# Patient Record
Sex: Female | Born: 1955 | Race: White | Hispanic: No | Marital: Single | Smoking: Never smoker
Health system: Southern US, Community
[De-identification: ages and names within clinical notes are randomized; demographics above are authoritative.]

## PROBLEM LIST (undated history)

## (undated) DIAGNOSIS — N289 Disorder of kidney and ureter, unspecified: Secondary | ICD-10-CM

## (undated) DIAGNOSIS — C801 Malignant (primary) neoplasm, unspecified: Secondary | ICD-10-CM

## (undated) DIAGNOSIS — K254 Chronic or unspecified gastric ulcer with hemorrhage: Secondary | ICD-10-CM

## (undated) DIAGNOSIS — Z992 Dependence on renal dialysis: Secondary | ICD-10-CM

## (undated) DIAGNOSIS — I10 Essential (primary) hypertension: Secondary | ICD-10-CM

## (undated) DIAGNOSIS — K623 Rectal prolapse: Secondary | ICD-10-CM

## (undated) DIAGNOSIS — K219 Gastro-esophageal reflux disease without esophagitis: Secondary | ICD-10-CM

## (undated) HISTORY — DX: Rectal prolapse: K62.3

## (undated) HISTORY — DX: Gastro-esophageal reflux disease without esophagitis: K21.9

## (undated) HISTORY — PX: ABDOMINAL HYSTERECTOMY: SHX81

---

## 2004-06-23 ENCOUNTER — Other Ambulatory Visit: Payer: Self-pay

## 2005-01-10 ENCOUNTER — Ambulatory Visit: Payer: Self-pay

## 2005-12-12 ENCOUNTER — Ambulatory Visit: Payer: Self-pay | Admitting: Family Medicine

## 2007-02-04 ENCOUNTER — Ambulatory Visit: Payer: Self-pay | Admitting: Family Medicine

## 2008-03-01 ENCOUNTER — Ambulatory Visit: Payer: Self-pay | Admitting: Gastroenterology

## 2008-03-01 DIAGNOSIS — K5790 Diverticulosis of intestine, part unspecified, without perforation or abscess without bleeding: Secondary | ICD-10-CM | POA: Insufficient documentation

## 2008-03-03 ENCOUNTER — Ambulatory Visit: Payer: Self-pay | Admitting: Family Medicine

## 2008-03-11 ENCOUNTER — Ambulatory Visit: Payer: Self-pay | Admitting: Gastroenterology

## 2008-05-11 DIAGNOSIS — R7989 Other specified abnormal findings of blood chemistry: Secondary | ICD-10-CM | POA: Insufficient documentation

## 2009-03-10 ENCOUNTER — Ambulatory Visit: Payer: Self-pay | Admitting: Family Medicine

## 2009-05-18 ENCOUNTER — Ambulatory Visit: Payer: Self-pay | Admitting: Obstetrics and Gynecology

## 2009-05-23 ENCOUNTER — Ambulatory Visit: Payer: Self-pay | Admitting: Obstetrics and Gynecology

## 2010-03-13 ENCOUNTER — Ambulatory Visit: Payer: Self-pay | Admitting: Family Medicine

## 2011-05-17 ENCOUNTER — Ambulatory Visit: Payer: Self-pay | Admitting: Family Medicine

## 2011-08-14 DIAGNOSIS — N184 Chronic kidney disease, stage 4 (severe): Secondary | ICD-10-CM | POA: Insufficient documentation

## 2011-08-14 DIAGNOSIS — F70 Mild intellectual disabilities: Secondary | ICD-10-CM | POA: Insufficient documentation

## 2011-08-15 DIAGNOSIS — H9190 Unspecified hearing loss, unspecified ear: Secondary | ICD-10-CM | POA: Insufficient documentation

## 2012-06-12 ENCOUNTER — Ambulatory Visit: Payer: Self-pay | Admitting: Physician Assistant

## 2012-12-30 ENCOUNTER — Ambulatory Visit: Payer: Self-pay | Admitting: Gastroenterology

## 2012-12-31 LAB — PATHOLOGY REPORT

## 2013-02-28 LAB — COMPREHENSIVE METABOLIC PANEL
Alkaline Phosphatase: 95 U/L (ref 50–136)
Anion Gap: 5 — ABNORMAL LOW (ref 7–16)
BUN: 41 mg/dL — ABNORMAL HIGH (ref 7–18)
Calcium, Total: 9 mg/dL (ref 8.5–10.1)
Chloride: 111 mmol/L — ABNORMAL HIGH (ref 98–107)
Creatinine: 2.81 mg/dL — ABNORMAL HIGH (ref 0.60–1.30)
EGFR (African American): 21 — ABNORMAL LOW
Glucose: 107 mg/dL — ABNORMAL HIGH (ref 65–99)
Osmolality: 294 (ref 275–301)
Potassium: 4.3 mmol/L (ref 3.5–5.1)
SGPT (ALT): 20 U/L (ref 12–78)
Sodium: 142 mmol/L (ref 136–145)
Total Protein: 7.1 g/dL (ref 6.4–8.2)

## 2013-02-28 LAB — CBC
HCT: 37.8 % (ref 35.0–47.0)
HGB: 12.7 g/dL (ref 12.0–16.0)
MCH: 30.6 pg (ref 26.0–34.0)
MCHC: 33.7 g/dL (ref 32.0–36.0)
Platelet: 191 10*3/uL (ref 150–440)
RBC: 4.17 10*6/uL (ref 3.80–5.20)
RDW: 13.4 % (ref 11.5–14.5)
WBC: 7.4 10*3/uL (ref 3.6–11.0)

## 2013-03-01 ENCOUNTER — Inpatient Hospital Stay: Payer: Self-pay | Admitting: Emergency Medicine

## 2013-03-01 LAB — CBC WITH DIFFERENTIAL/PLATELET
Basophil #: 0 10*3/uL (ref 0.0–0.1)
Basophil %: 0.4 %
Eosinophil #: 0 10*3/uL (ref 0.0–0.7)
Eosinophil %: 0.2 %
HCT: 33.8 % — ABNORMAL LOW (ref 35.0–47.0)
HGB: 11.3 g/dL — ABNORMAL LOW (ref 12.0–16.0)
Lymphocyte #: 0.6 10*3/uL — ABNORMAL LOW (ref 1.0–3.6)
MCV: 91 fL (ref 80–100)
Monocyte %: 3.8 %
Neutrophil #: 9.3 10*3/uL — ABNORMAL HIGH (ref 1.4–6.5)
Neutrophil %: 89.9 %
Platelet: 146 10*3/uL — ABNORMAL LOW (ref 150–440)
RBC: 3.72 10*6/uL — ABNORMAL LOW (ref 3.80–5.20)
RDW: 13.4 % (ref 11.5–14.5)

## 2013-03-01 LAB — PROTIME-INR
INR: 0.9
Prothrombin Time: 12.8 secs (ref 11.5–14.7)

## 2013-03-02 LAB — CBC WITH DIFFERENTIAL/PLATELET
Eosinophil %: 0.1 %
HCT: 33.4 % — ABNORMAL LOW (ref 35.0–47.0)
Lymphocyte #: 0.7 10*3/uL — ABNORMAL LOW (ref 1.0–3.6)
Lymphocyte %: 6.1 %
MCHC: 34.2 g/dL (ref 32.0–36.0)
MCV: 91 fL (ref 80–100)
Monocyte %: 4.4 %
Neutrophil #: 10.7 10*3/uL — ABNORMAL HIGH (ref 1.4–6.5)
Neutrophil %: 88.9 %
Platelet: 162 10*3/uL (ref 150–440)
RDW: 13.5 % (ref 11.5–14.5)
WBC: 12 10*3/uL — ABNORMAL HIGH (ref 3.6–11.0)

## 2013-03-02 LAB — BASIC METABOLIC PANEL
Anion Gap: 5 — ABNORMAL LOW (ref 7–16)
Creatinine: 2.28 mg/dL — ABNORMAL HIGH (ref 0.60–1.30)
EGFR (African American): 27 — ABNORMAL LOW
Glucose: 141 mg/dL — ABNORMAL HIGH (ref 65–99)
Sodium: 139 mmol/L (ref 136–145)

## 2013-03-03 LAB — CBC WITH DIFFERENTIAL/PLATELET
Basophil #: 0.1 10*3/uL (ref 0.0–0.1)
Basophil %: 0.7 %
Eosinophil #: 0.4 10*3/uL (ref 0.0–0.7)
Eosinophil %: 5.1 %
HCT: 32.6 % — ABNORMAL LOW (ref 35.0–47.0)
Lymphocyte #: 1.1 10*3/uL (ref 1.0–3.6)
Lymphocyte %: 12.7 %
MCH: 31.3 pg (ref 26.0–34.0)
MCHC: 34.3 g/dL (ref 32.0–36.0)
MCV: 91 fL (ref 80–100)
Monocyte %: 5.5 %
Neutrophil #: 6.5 10*3/uL (ref 1.4–6.5)
Platelet: 146 10*3/uL — ABNORMAL LOW (ref 150–440)
RBC: 3.58 10*6/uL — ABNORMAL LOW (ref 3.80–5.20)

## 2013-03-04 LAB — PATHOLOGY REPORT

## 2013-03-04 LAB — RENAL FUNCTION PANEL
Albumin: 2.4 g/dL — ABNORMAL LOW (ref 3.4–5.0)
Anion Gap: 4 — ABNORMAL LOW (ref 7–16)
BUN: 35 mg/dL — ABNORMAL HIGH (ref 7–18)
Chloride: 101 mmol/L (ref 98–107)
Co2: 30 mmol/L (ref 21–32)
Creatinine: 2.76 mg/dL — ABNORMAL HIGH (ref 0.60–1.30)
EGFR (African American): 21 — ABNORMAL LOW
EGFR (Non-African Amer.): 18 — ABNORMAL LOW
Osmolality: 278 (ref 275–301)
Potassium: 4.2 mmol/L (ref 3.5–5.1)
Sodium: 135 mmol/L — ABNORMAL LOW (ref 136–145)

## 2013-03-05 LAB — RENAL FUNCTION PANEL
Albumin: 2.2 g/dL — ABNORMAL LOW (ref 3.4–5.0)
Calcium, Total: 8.9 mg/dL (ref 8.5–10.1)
Chloride: 100 mmol/L (ref 98–107)
Potassium: 3.9 mmol/L (ref 3.5–5.1)
Sodium: 135 mmol/L — ABNORMAL LOW (ref 136–145)

## 2013-03-06 LAB — CBC WITH DIFFERENTIAL/PLATELET
Eosinophil #: 0.5 10*3/uL (ref 0.0–0.7)
HGB: 11.2 g/dL — ABNORMAL LOW (ref 12.0–16.0)
Lymphocyte #: 1.2 10*3/uL (ref 1.0–3.6)
MCHC: 32.8 g/dL (ref 32.0–36.0)
MCV: 91 fL (ref 80–100)
Neutrophil #: 4.2 10*3/uL (ref 1.4–6.5)
Platelet: 202 10*3/uL (ref 150–440)
RBC: 3.77 10*6/uL — ABNORMAL LOW (ref 3.80–5.20)
RDW: 12.7 % (ref 11.5–14.5)
WBC: 6.4 10*3/uL (ref 3.6–11.0)

## 2013-03-06 LAB — RENAL FUNCTION PANEL
Albumin: 2.4 g/dL — ABNORMAL LOW (ref 3.4–5.0)
Anion Gap: 3 — ABNORMAL LOW (ref 7–16)
Calcium, Total: 9 mg/dL (ref 8.5–10.1)
Co2: 31 mmol/L (ref 21–32)
Phosphorus: 3.7 mg/dL (ref 2.5–4.9)

## 2013-03-09 LAB — RENAL FUNCTION PANEL
Anion Gap: 4 — ABNORMAL LOW (ref 7–16)
BUN: 39 mg/dL — ABNORMAL HIGH (ref 7–18)
Chloride: 102 mmol/L (ref 98–107)
Co2: 29 mmol/L (ref 21–32)
Creatinine: 2.82 mg/dL — ABNORMAL HIGH (ref 0.60–1.30)
EGFR (African American): 21 — ABNORMAL LOW
EGFR (Non-African Amer.): 18 — ABNORMAL LOW
Osmolality: 279 (ref 275–301)
Phosphorus: 3.5 mg/dL (ref 2.5–4.9)

## 2013-07-23 ENCOUNTER — Ambulatory Visit: Payer: Self-pay | Admitting: Physician Assistant

## 2014-05-31 DIAGNOSIS — E211 Secondary hyperparathyroidism, not elsewhere classified: Secondary | ICD-10-CM | POA: Insufficient documentation

## 2014-05-31 DIAGNOSIS — D649 Anemia, unspecified: Secondary | ICD-10-CM | POA: Insufficient documentation

## 2014-07-27 ENCOUNTER — Ambulatory Visit: Payer: Self-pay | Admitting: Physician Assistant

## 2014-11-03 DIAGNOSIS — K3189 Other diseases of stomach and duodenum: Secondary | ICD-10-CM | POA: Insufficient documentation

## 2014-11-03 DIAGNOSIS — Q605 Renal hypoplasia, unspecified: Secondary | ICD-10-CM

## 2014-11-03 DIAGNOSIS — Q602 Renal agenesis, unspecified: Secondary | ICD-10-CM | POA: Insufficient documentation

## 2014-11-26 ENCOUNTER — Emergency Department: Payer: Self-pay | Admitting: Internal Medicine

## 2015-02-22 DIAGNOSIS — I071 Rheumatic tricuspid insufficiency: Secondary | ICD-10-CM | POA: Insufficient documentation

## 2015-02-28 DIAGNOSIS — R079 Chest pain, unspecified: Secondary | ICD-10-CM | POA: Insufficient documentation

## 2015-03-08 DIAGNOSIS — I340 Nonrheumatic mitral (valve) insufficiency: Secondary | ICD-10-CM | POA: Insufficient documentation

## 2015-03-25 NOTE — Op Note (Signed)
PATIENT NAME:  Brianna Hamilton, Brianna Hamilton MR#:  [id number] DATE OF BIRTH:  Aug 22, 1956  DATE OF PROCEDURE:  03/01/2013  PREOPERATIVE DIAGNOSIS: Rectal prolapse.   POSTOPERATIVE DIAGNOSIS: Rectal prolapse.  OPERATION:  1.  Exploratory laparotomy, lysis of adhesions because of the previous hysterectomy surgery.  2.  Sigmoid colon resection with low rectal anastomosis.  3.  Rectopexy.  4.  Rigid sigmoidoscopy.  5.  Relief of prolapse by pushing the bowel and rectum inside the perineal area after she went to sleep.   INDICATIONS FOR PROCEDURE: This 59 year old was seen by me in the middle of the night because of the history of rectal prolapse. The patient had a colonoscopy done about 2 months ago, and she has been having trouble with rectal prolapse for this long time.  She is mentally retarded, and she strains a lot on her bowel movements. The patient was seen by Dr. Tamala Julian, who did a rubber band ligation on her, but then she came back in the middle of the night last night because rectal prolapse was again noticed and it was not going to go in.  I  came in, and since she has been constipated for a long time, I could not really give her a bowel prep because the obstruction  rectal prolapse was acute, so I needed to stabilize her medically because when she came in she had drank water as well as her blood pressure was sky high. She was also in kidney failure; and she is not getting dialysis yet, but she has the fistula made in the left forearm for the dialysis. I did a consultation with [facility] and  admitted this patient in the hospital and stabilized her, and then today I brought her to surgery.   OPERATIVE PROCEDURE: After the patient was put to sleep, she was put in the stirrups, and rectal examination showed the patient had the rubber bands still attached on the rectal wall, and she had a little necrosis on the side of it, but the bowel itself looked pink. It was then reduced easily into the rectum. I did a  rectal examination, had a lot of edema and swelling. After this was performed, the rectal part as well as the abdominal part were then prepped and draped. I made a low midline incision. After cutting skin and subcutaneous tissue, the fascia was then cut.  The abdomen was opened after opening the peritoneum, The patient did have a lot of adhesions because of the previous hysterectomy. All these adhesions were then released. Omentum was stuck to the bladder flap, which was then released. The patient had multiple loops of small bowel stuck to each other and in the omentum causing partial bowel obstruction. That is why she was having abdominal pain preoperatively, so I released all the small bowel to the cecum. The patient did have an appendectomy in the past, also. I did not see the appendix. The sigmoid colon had like 2 or 3 bends, and it was quite large and redundant. First of all, incision was made on the lateral aspect of the sigmoid colon on the line of Toldt and dissection was done.  The ureter even could easily be visualized. It was then pushed back medially, and the lateral dissection was done on the line of Toldt all the way down into the hollow of sacrum.  Then on the right side, I made an incision with the scissor and dissected with the Harmonic scalpel all the way down to the bladder flap and went  anterior to the bladder. After pulling the colon out of the hollow of sacrum, it was quite lax, so I pulled out quite a bit and went to the hollow of sacrum and dissected from hollow of the sacrum in the rectum almost to the coccyx area anteriorly as well as posteriorly. After that, I transected the rectum with a TA-60, and the proximal colon was then clamped with a bowel clamp and pushed out. After that, the redundant sigmoid colon was then dissected so that when we do the anastomosis it is not lax. After this first, the proximal sigmoid colon, I opened up and put a pursestring suture, and dilated it, and put a  #29 anvil into the sigmoid colon. The rest of the sigmoid colon which was very redundant was then resected with the Harmonic scalpel, and major blood vessels were tied to make sure that we did not injure the ureters on both sides, and the specimen was then given to the nurse. After that, the trocar with the EEA with a #29 went from the rectum and stapled EEA together, and the patient had good anastomosis, and then I put a sigmoidoscope into the rectum and did the sigmoidoscopy. It did show very hemorrhagic rectal area because of the rectal prolapse which she was having constantly for some time now.  Then, I did not see anything up until 25 cm of the rectosigmoid area, and then I did not see anything to the anastomotic area; and I put some water in the pelvis and  then insufflated some air through the rectum, and there was no leak. After this was done, I did a posterior rectopexy. I did not put a mesh there because the patient was not prepped, and we did an anastomosis there, as I did not want to leave a mesh, but I sutured with 3-0 silk to the posterior wall of the rectum to the middle of sacrum as well as the sacral promontory area as well as anteriorly to the bladder flap and also laterally.  About 4 or 5 stitches were applied; and after that, irrigation was done to make sure there was no bleeding; and once there was no bleeding, then we closed the peritoneal surface with running 0 Vicryl sutures. Small bowel was then ran back again to the ligament of Treitz and made sure there was no obstruction.  The rest of the exploration of the abdomen was basically normal. Then after the sponge count and instrument count was correct, the peritoneum and the fascia were then closed with running #2 suture plus interrupted 0 Vicryl suture in the middle.  Good repair was then performed, then the skin was closed with skin staples and dressing applied. The patient tolerated the procedure well, sent to the recovery room in  satisfactory condition.   ____________________________ Welford Roche Phylis Bougie, MD msh:cb D: 03/01/2013 13:32:02 ET T: 03/01/2013 15:36:42 ET JOB#: [id number]  cc: Josian Lanese S. Phylis Bougie, MD, <Dictator> Milinda Pointer. Jacqualine Code, MD Dr. Iran Planas   Welford Roche Kayly Kriegel MD ELECTRONICALLY SIGNED 03/03/2013 12:15

## 2015-03-25 NOTE — Consult Note (Signed)
PATIENT NAME:  Brianna Hamilton, Brianna Hamilton MR#:  [id number] DATE OF BIRTH:  [birth year]  DATE OF CONSULTATION:  03/01/2013  REFERRING PHYSICIAN:  Dr. Phylis Bougie.  CONSULTING PHYSICIAN:  Albertine Patricia, MD  PRIMARY CARE PHYSICIAN:  Following at [facility]. She used to follow with  Dr. Jacqualine Code; she was seen by Boykin Reaper.   REASON FOR MEDICAL CONSULT:  Preoperative evaluation and medical management for rectal prolapse.   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 The patient is known to have a history of diverticulosis, where she had recently had rubber band ligation by Dr. Tamala Julian. The patient was seen and evaluated by Dr. Phylis Bougie with a plan to operate in the a.m., so a medical consult was called to evaluate her medically prior to the surgery and to help with management of her medical conditions. The patient is known to have a history of chronic kidney disease, has agenesis of the right kidney, congenital, with a history of hypertension. Her blood pressure was uncontrolled in the ED with a systolic of [id number]. As well, she reports she took her medication, and her creatinine went up from baseline 2.3 to 2.4, to 2.8 today with BUN of 40. The patient denies any chest pain, any shortness of breath. Reports she walks on a daily basis for long distances without any complaints. By reviewing the patient's record, it appears she had a negative stress test done in July of last year. The patient had venous access done in the right arm in case she will need to be started on hemodialysis. The patient has complaints of chronic constipation. The patient has been having rectal prolapse on and off for the last 2 to 3 months. Where she had colonoscopy done earlier this year, as well she had rubber band ligation done by Dr. Tamala Julian to prevent her prolapse, which did not stop her from prolapse. She had a prolapsed  rectum for the last 2 days which a surgeon tried to reduce,  where it was unsuccessful.   PAST MEDICAL HISTORY: 1.  Hypertension.  2. Agenesis of the right kidney.  3.  Chronic kidney disease, stage IV.  4.  Diverticulosis with tortuous colon.  5.  Low vitamin D state.   PAST SURGICAL HISTORY: 1.  Venous access, right arm.  2.  Bilateral oophorectomy.  3.  History of hysterectomy.   CURRENT MEDICATIONS:  1.  Diovan 80 mg oral daily.  2.  Amlodipine 5 mg oral daily.  3.  Sodium bicarbonate 650 mg oral 2 times a day.  4.  Rena-Vite 1 tablet 3 times a day with meals.   ALLERGIES: No known drug allergies.   SOCIAL HISTORY: Lives at home alone. Does not smoke. Does not drink any alcohol.   FAMILY HISTORY:  Positive for hypertension, colon cancer, ovarian cancer, breast cancer.   REVIEW OF SYSTEMS: CONSTITUTIONAL: Denies any fever, chills, weakness, fatigue.  EYES: Denies blurry vision, double vision, inflammation, glaucoma.  ENT: Denies tinnitus, ear pain, hearing loss, epistaxis.  RESPIRATORY: Denies cough, wheezing, hemoptysis, shortness of breath.  CARDIOVASCULAR: Denies chest pain, edema, arrhythmia, and palpitations.  GASTROINTESTINAL: Denies nausea, vomiting, diarrhea, abdominal pain, hematemesis, melena. Has some minimal bright red blood from the rectal prolapse site, as well has chronic constipation.  GENITOURINARY: Denies dysuria, hematuria, renal colic.  ENDOCRINE: Denies polyuria, polydipsia, heat or cold intolerance.  HEMATOLOGY: Denies anemia, easy bruising, bleeding diathesis.  INTEGUMENTARY:  Denies acne, rash or skin lesions.  MUSCULOSKELETAL: Denies neck pain, shoulder pain, hip pain, arthritis.  NEUROLOGIC: Denies numbness, weakness, dysarthria, epilepsy, tremors, vertigo.  PSYCHIATRIC: Denies anxiety, insomnia. Has history of moderate mental retardation.   PHYSICAL EXAMINATION: VITAL SIGNS: Temperature 98.3, pulse 80, respiratory rate 20, blood pressure 174/94,  saturating 97% on room air.  GENERAL: A well-nourished female, resting comfortably, in no apparent distress.  HEENT: Head atraumatic, normocephalic. Pupils equal, reactive to light. Pink conjunctivae. Anicteric sclerae. Moist oral mucosa.  NECK: Supple. No thyromegaly. No JVD.  CHEST: Good air entry bilaterally. No wheezing, rales or rhonchi.  CARDIOVASCULAR: S1, S2 heard. No rubs or gallops. Has systolic ejection murmur.  ABDOMEN: Soft, nontender, nondistended. Bowel sounds present.  EXTREMITIES: No edema. No clubbing. No cyanosis.  RECTAL:  The patient had a prolapse with some small spots of ulceration.  NEUROLOGIC: Cranial nerves grossly intact. Motor 5/5.  PSYCHIATRIC: The patient is pleasant, appropriate. The patient is hard of hearing and is reading lips and communicative, answering questions appropriately.   PERTINENT LABS: Glucose 107, BUN 41, creatinine 2.81, sodium 142, potassium 4.3, chloride 111, CO2 26.   White blood cells 7.4, hemoglobin 12.7, hematocrit 37.8, platelets 191. INR 0.9.   ASSESSMENT AND PLAN:  This is a 59 year old female with history of:  1.  Moderate mental retardation: Presents with recurrent rectal prolapse. Not reducible in the Emergency Department by the surgeon, requiring to go to surgery in a.m.  Preoperative evaluation: The patient has uncontrolled blood pressure and worsening renal function, so will increase Norvasc dose to 10 mg oral daily and will start her on p.o. hydralazine 25 mg oral 3 times a day, as well will keep her on p.r.n. IV hydralazine for uncontrolled blood pressure. Will hold her Diovan due to her worsening renal failure, and for her worsening renal function we will consult nephrology service. We will have her on fluids, 100 mL per hour and will continue to hold her Diovan. The patient has a murmur, unclear if it is old or new, so will obtain 2-D echo to evaluate, but this should not delay the surgery as the patient had a negative stress test  done last July and she is asymptomatic. No chest pain. No shortness of breath. Ambulates on a daily basis for a long distance without any symptoms. Will obtain an EKG.  2.  Deep vein thrombosis prophylaxis: Will start on sequential compression device and will leave it up to surgical service to start on chemical anticoagulation after surgery.  3.  Chronic kidney disease: The patient has a history of chronic kidney disease, stage IV. Already has a right arteriovenous fistula for possible need of hemodialysis. Will consult nephrology service and will hold Diovan and continue with hydration.   Total time spent on medical consult and patient care: Fifty-five minutes.      ____________________________ Albertine Patricia, MD dse:dm D: 03/01/2013 04:10:00 ET T: 03/01/2013 07:31:02 ET JOB#: [id number]  cc: Albertine Patricia, MD, <Dictator> Aline Wesche Graciela Husbands MD ELECTRONICALLY SIGNED 03/02/2013 5:35

## 2015-03-25 NOTE — Consult Note (Signed)
Chief Complaint:  Subjective/Chief Complaint Seen in f/u for CKD  She's feeling well.  Eating a full breakfast this morning of grits, toast, boiled egg and team.  Ambulating and using BSC without difficulty.   VITAL SIGNS/ANCILLARY NOTES: **Vital Signs.:   04-Apr-14 05:02  Vital Signs Type Routine  Temperature Temperature (F) 98  Celsius 36.6  Temperature Source oral  Pulse Pulse 73  Respirations Respirations 18  Systolic BP Systolic BP 903  Diastolic BP (mmHg) Diastolic BP (mmHg) 76  Mean BP 90  Pulse Ox % Pulse Ox % 93  Pulse Ox Activity Level  At rest  Oxygen Delivery Room Air/ 21 %  *Intake and Output.:   Daily 04-Apr-14 07:00  Grand Totals Intake:  930 Output:  975    Net:  -24 24 Hr.:  -45  Oral Intake      In:  930  Urine ml     Out:  975  Length of Stay Totals Intake:  8985 Output:  00923    Net:  -2140   Brief Assessment:  Additional Physical Exam Gen appears well Eyes anicteric ENT MMM CV RRR Lungs clear Abd LLQ with dressing, +BS Extr no edema, RUE with AVF with thrill and bruit   Lab Results: Hepatic:  04-Apr-14 05:12   Albumin, Serum  2.4  Routine Chem:  04-Apr-14 05:12   Glucose, Serum 90  BUN  41  Creatinine (comp)  2.75  Sodium, Serum 136  Potassium, Serum 4.2  Chloride, Serum 102  CO2, Serum 31  Calcium (Total), Serum 9.0  Phosphorus, Serum 3.7  Anion Gap  3  Osmolality (calc) 282  eGFR (African American)  21  eGFR (Non-African American)  18 (eGFR values <42m/min/1.73 m2 may be an indication of chronic kidney disease (CKD). Calculated eGFR is useful in patients with stable renal function. The eGFR calculation will not be reliable in acutely ill patients when serum creatinine is changing rapidly. It is not useful in  patients on dialysis. The eGFR calculation may not be applicable to patients at the low and high extremes of body sizes, pregnant women, and vegetarians.)  Routine Hem:  04-Apr-14 05:12   WBC (CBC) 6.4  RBC (CBC)   3.77  Hemoglobin (CBC)  11.2  Hematocrit (CBC)  34.2  Platelet Count (CBC) 202  MCV 91  MCH 29.8  MCHC 32.8  RDW 12.7  Neutrophil % 66.0  Lymphocyte % 18.5  Monocyte % 7.0  Eosinophil % 7.6  Basophil % 0.9  Neutrophil # 4.2  Lymphocyte # 1.2  Monocyte # 0.4  Eosinophil # 0.5  Basophil # 0.1 (Result(s) reported on 06 Mar 2013 at 05:57AM.)   Assessment/Plan:  Assessment/Plan:  Assessment 41F with mental retardation, HTN, CKD IV (BL Cr 2.3-2.8), chronic rectal prolapse who is admitted following low anterior colonic resection with rectopexy for chronic rectal prolapse.   Plan ***Chronic kidney disease:   She's been essentially at her baseline during the admission with baseline being 2.1-2.8.  She's maintaining good UOP with oral hydration.  Will plan next labs on 4/6 given she's been doing well and renal function has been stable.   **HTN:  Given BPs in the 100s 36 hours ago reduced amlodipine to 546mdaily so will monitor today on lower dose.  If BPs even lower would recommend fluid challenge of NS 50045molus to see if it helps though she does not currently appear hypovolemic.  If discharged would not resume diovan until she's seen in clinic.   **Anemia:  Mild, no indications for epogen.  CBC stable this morning.  **Discontinued lovenox in favor of heparin 5000u SQ TID given renal function.    Will follow along with you but peripherally at this time given she's been doing well, please don't hesitate to contact me with any questions or concerns. Jannifer Hick pager [phone]   Electronic Signatures: Justin Mend (MD)  (Signed 04-Apr-14 09:13)  Authored: Chief Complaint, VITAL SIGNS/ANCILLARY NOTES, Brief Assessment, Lab Results, Assessment/Plan   Last Updated: 04-Apr-14 09:13 by Justin Mend (MD)

## 2015-03-25 NOTE — Discharge Summary (Signed)
Dates of Admission and Diagnosis:  Date of Admission 01-Mar-2013   Date of Discharge 10-Mar-2013   Admitting Diagnosis rectal prolapse   Final Diagnosis rectal prolapse    Chief Complaint/History of Present Illness Pt presented to ED with rectal prolapse and constipation.   LabObservation:  30-Mar-14 10:47   OBSERVATION Reason for Test  Hepatic:  29-Mar-14 22:44   Albumin, Serum 3.5  Bilirubin, Total 0.3  Alkaline Phosphatase 95  SGPT (ALT) 20  SGOT (AST) 24  Total Protein, Serum 7.1  02-Apr-14 05:16   Albumin, Serum  2.4  03-Apr-14 05:07   Albumin, Serum  2.2  04-Apr-14 05:12   Albumin, Serum  2.4  07-Apr-14 07:52   Albumin, Serum  2.7  Pathology:  31-Mar-14 00:12   Pathology Report ========== TEST NAME ==========  ========= RESULTS =========  = REFERENCE RANGE =  PATHOLOGY REPORT  Pathology Report .                               [   Final Report         ]                   Material submitted:         Marland Kitchen SIGMOID COLON .                               [   Final Report         ]                   Pre-operative diagnosis:                                        . RECTAL PROLAPSE REDUCTION RECTAL PROLAPSE, SIGMOID COLECTOMY, LOWER ANTERIOR ANASTOMOSIS .                               [   Final Report         ]                   ********************************************************************** Diagnosis: SIGMOID COLON: - DIVERTICULOSIS. - NEGATIVE FOR DYSPLASIA AND MALIGNANCY. . XDB/03/04/2013 ********************************************************************** .                               [   Final Report         ]                   Electronically signed:                                     . Lum Babe, MD, Pathologist .                               [   Final Report         ]                   Gross description:                                         .  Received in a formalin filled container labeled Dorothy Willemsen and sigmoid colon is a 20.5 cm  in length segment of large bowel grossly consistent with sigmoid colon. The serosal surfaces are tan pink smooth and glistening. One margin is stapled closed and the opposite margin is opened. The mucosa is tan pink with grossly normal transverse folds. There are focal areas of decreased number of transverse folds. Sectioning reveals a possible focal diverticula. No discrete masses are grossly seen. Representative sections are submitted: A1 - stapled margin, en face; A2 - opposite open margin, en face; A3 - representative sections of possible diverticula; A4 - additional representative sections of bowel. Part A, Block 1 - 2 pieces Part A, Block 2 - 1 piece Part A, Block 3 - 2 pieces Part A, Block 4 - 2 pieces QAC/KCT .                       [   Final Report         ]                   Pathologist provided ICD-9: 21.11 .                               [   Final Report         ]                   CPT                                                        . [id number]               [facility]            No: [id number]           [address] [place],  [address]           Lindon Romp, [place]                                         [id number]   Result(s) reported on 04 Mar 2013 at 03:19PM.  Routine BB:  30-Mar-14 04:31   ABO Group + Rh Type A Positive  Antibody Screen NEGATIVE (Result(s) reported on 01 Mar 2013 at 05:15AM.)  Cardiology:  30-Mar-14 04:03   Ventricular Rate 82  Atrial Rate 82  P-R Interval 156  QRS Duration 86  QT 384  QTc 448  P Axis 43  R Axis -35  T Axis 39  ECG interpretation Normal sinus rhythm Left axis deviation Nonspecific T wave abnormality Abnormal ECG When compared with ECG of 18-May-2009 11:31, Vent. rate has increased BY  27 BPM ----------unconfirmed---------- Confirmed by OVERREAD, NOT ([id number]), editor PEARSON, BARBARA ([id number]) on 03/03/2013 2:42:09 PM    10:47   Echo Doppler REASON FOR  EXAM:     COMMENTS:     PROCEDURE: [facility] - ECHO DOPPLER COMPLETE  - Mar 01 2013 10:47AM   RESULT: Echocardiogram Report  Patient Name:   Brianna Hamilton Date of  Exam: 03/01/2013 Medical Rec #:  [id number]                  Custom1: Date of Birth:  29-May-1956              Height:       61.0 in Patient Age:    59 years                Weight:       121.0 lb Patient Gender: F                       BSA:          1.53 m??  Indications: Murmur Sonographer:    Janalee Dane RCS Referring Phys: Vella Kohler, S  Summary:  1. Left ventricular ejection fraction, by visual estimation, is 60 to  65%.  2. Moderately dilated left atrium.  3. Moderately dilated right atrium.  4. Moderate to severe mitral valve regurgitation.  5. Mildly increased left ventricular internal cavity size.  6. Moderate tricuspid regurgitation. 2D AND M-MODE MEASUREMENTS (normal ranges within parentheses): Left Ventricle:          Normal IVSd (2D):      0.80 cm (0.7-1.1) LVPWd (2D):     1.07 cm (0.7-1.1) Aorta/LA:                  Normal LVIDd (2D):     5.36 cm (3.4-5.7) Aortic Root (2D): 3.10 cm (2.4-3.7) LVIDs (2D):     3.22 cm           Left Atrium (2D): 4.40 cm (1.9-4.0) LV FS (2D):     39.9 %   (>25%) LV EF (2D):     70.1 %(>50%)                                   Right Ventricle:                                   RVd (2D): SPECTRAL DOPPLER ANALYSIS (where applicable):  PHYSICIAN INTERPRETATION: Left Ventricle: The left ventricular internal cavity size was mildly  increased. LV posterior wall thickness was normal. Left ventricular  ejection fraction, by visual estimation, is 60 to 65%. Right Ventricle: Normal right ventricular size, wall thickness, and  systolic function. RV wall thickness is normal. LeftAtrium: The left atrium is moderately dilated. Right Atrium: The right atrium is moderately dilated. Pericardium: There is no evidence of pericardial effusion. Mitral Valve: Moderate to severe mitral  valve regurgitation is seen. Tricuspid Valve: Moderate tricuspid regurgitation is visualized. Aortic Valve: The aortic valve is trileaflet and structurally normal,  with normal leaflet excursion; without any evidence of aortic stenosis or  insufficiency. Pulmonic Valve: Structurally normal pulmonic valve, with normal leaflet  excursion. Aorta: The aortic root and ascending aorta are structurally normal, with  no evidence of dilitation.  Calais MD Electronically signed by [id number] Serafina Royals MD Signature Date/Time: 03/01/2013/4:26:50 PM *** Final ***  IMPRESSION: .    Verified By: Corey Skains  ([facility]), M.D., MD  Routine Chem:  29-Mar-14 22:44   Glucose, Serum  107  BUN  41  Creatinine (comp)  2.81  Sodium, Serum 142  Potassium, Serum 4.3  Chloride, Serum  111  CO2, Serum 26  Calcium (Total), Serum 9.0  Anion Gap  5  Osmolality (calc) 294  eGFR (African American)  21  eGFR (Non-African American)  18 (eGFR values <12m/min/1.73 m2 may be an indication of chronic kidney disease (CKD). Calculated eGFR is useful in patients with stable renal function. The eGFR calculation will not be reliable in acutely ill patients when serum creatinine is changing rapidly. It is not useful in  patients on dialysis. The eGFR calculation may not be applicable to patients at the low and high extremes of body sizes, pregnant women, and vegetarians.)  31-Mar-14 05:12   Glucose, Serum  141  BUN  26  Creatinine (comp)  2.28  Sodium, Serum 139  Potassium, Serum 3.8  Chloride, Serum 107  CO2, Serum 27  Calcium (Total), Serum 8.8  Anion Gap  5  Osmolality (calc) 285  eGFR (African American)  27  eGFR (Non-African American)  23 (eGFR values <656mmin/1.73 m2 may be an indication of chronic kidney disease (CKD). Calculated eGFR is useful in patients with stable renal function. The eGFR calculation will not be reliable in acutely ill patients when serum creatinine is  changing rapidly. It is not useful in  patients on dialysis. The eGFR calculation may not be applicable to patients at the low and high extremes of body sizes, pregnant women, and vegetarians.)  02-Apr-14 05:16   Glucose, Serum 92  BUN  35  Creatinine (comp)  2.76  Sodium, Serum  135  Potassium, Serum 4.2  Chloride, Serum 101  CO2, Serum 30  Calcium (Total), Serum 8.7  Phosphorus, Serum 4.0  Anion Gap  4  Osmolality (calc) 278  eGFR (African American)  21  eGFR (Non-African American)  18 (eGFR values <608min/1.73 m2 may be an indication of chronic kidney disease (CKD). Calculated eGFR is useful in patients with stable renal function. The eGFR calculation will not be reliable in acutely ill patients when serum creatinine is changing rapidly. It is not useful in  patients on dialysis. The eGFR calculation may not be applicable to patients at the low and high extremes of body sizes, pregnant women, and vegetarians.)  03-Apr-14 05:07   Glucose, Serum 88  BUN  38  Creatinine (comp)  2.94  Sodium, Serum  135  Potassium, Serum 3.9  Chloride, Serum 100  CO2, Serum 29  Calcium (Total), Serum 8.9  Phosphorus, Serum 3.7  Anion Gap  6  Osmolality (calc) 279 (Result(s) reported on 05 Mar 2013 at 05:44AM.)  04-Apr-14 05:12   Glucose, Serum 90  BUN  41  Creatinine (comp)  2.75  Sodium, Serum 136  Potassium, Serum 4.2  Chloride, Serum 102  CO2, Serum 31  Calcium (Total), Serum 9.0  Phosphorus, Serum 3.7  Anion Gap  3  Osmolality (calc) 282  eGFR (African American)  21  eGFR (Non-African American)  18 (eGFR values <51m19mn/1.73 m2 may be an indication of chronic kidney disease (CKD). Calculated eGFR is useful in patients with stable renal function. The eGFR calculation will not be reliable in acutely ill patients when serum creatinine is changing rapidly. It is not useful in  patients on dialysis. The eGFR calculation may not be applicable to patients at the low and high  extremes of body sizes, pregnant women, and vegetarians.)  07-Apr-14 07:52   Glucose, Serum 92  BUN  39  Creatinine (comp)  2.82  Sodium, Serum  135  Potassium, Serum 4.7  Chloride, Serum 102  CO2, Serum 29  Calcium (Total), Serum 9.2  Phosphorus, Serum 3.5  Anion Gap  4  Osmolality (calc) 279  eGFR (African American)  21  eGFR (Non-African American)  18 (eGFR values <63m/min/1.73 m2 may be an indication of chronic kidney disease (CKD). Calculated eGFR is useful in patients with stable renal function. The eGFR calculation will not be reliable in acutely ill patients when serum creatinine is changing rapidly. It is not useful in  patients on dialysis. The eGFR calculation may not be applicable to patients at the low and high extremes of body sizes, pregnant women, and vegetarians.)  Routine Coag:  29-Mar-14 22:44   Prothrombin 12.8  INR 0.9 (INR reference interval applies to patients on anticoagulant therapy. A single INR therapeutic range for coumarins is not optimal for all indications; however, the suggested range for most indications is 2.0 - 3.0. Exceptions to the INR Reference Range may include: Prosthetic heart valves, acute myocardial infarction, prevention of myocardial infarction, and combinations of aspirin and anticoagulant. The need for a higher or lower target INR must be assessed individually. Reference: The Pharmacology and Management of the Vitamin K  antagonists: the seventh ACCP Conference on Antithrombotic and Thrombolytic Therapy. CWUJWJ.1914Sept:126 (3suppl): [id number] A HCT value >55% may artifactually increase the PT.  In one study,  the increase was an average of 25%. Reference:  "Effect on Routine and Special Coagulation Testing Values of Citrate Anticoagulant Adjustment in Patients with High HCT Values." American Journal of Clinical Pathology 2006;126:400-405.)  Routine Hem:  29-Mar-14 22:44   Platelet Count (CBC) 191 (Result(s) reported on 28 Feb 2013 at 10:56PM.)  WBC (CBC) 7.4  RBC (CBC) 4.17  Hemoglobin (CBC) 12.7  Hematocrit (CBC) 37.8  MCV 91  MCH 30.6  MCHC 33.7  RDW 13.4  30-Mar-14 15:45   Platelet Count (CBC)  146  WBC (CBC) 10.4  RBC (CBC)  3.72  Hemoglobin (CBC)  11.3  Hematocrit (CBC)  33.8  MCV 91  MCH 30.4  MCHC 33.5  RDW 13.4  Neutrophil % 89.9  Lymphocyte % 5.7  Monocyte % 3.8  Eosinophil % 0.2  Basophil % 0.4  Neutrophil #  9.3  Lymphocyte #  0.6  Monocyte # 0.4  Eosinophil # 0.0  Basophil # 0.0 (Result(s) reported on 01 Mar 2013 at 04:05PM.)    20:20   Hemoglobin (CBC)  11.6 (Result(s) reported on 01 Mar 2013 at 08:39PM.)  31-Mar-14 05:12   Platelet Count (CBC) 162  WBC (CBC)  12.0  RBC (CBC)  3.68  Hemoglobin (CBC)  11.4  Hematocrit (CBC)  33.4  MCV 91  MCH 31.0  MCHC 34.2  RDW 13.5  Neutrophil % 88.9  Lymphocyte % 6.1  Monocyte % 4.4  Eosinophil % 0.1  Basophil % 0.5  Neutrophil #  10.7  Lymphocyte #  0.7  Monocyte # 0.5  Eosinophil # 0.0  Basophil # 0.1 (Result(s) reported on 02 Mar 2013 at 06:25AM.)  01-Apr-14 05:29   Platelet Count (CBC)  146  WBC (CBC) 8.6  RBC (CBC)  3.58  Hemoglobin (CBC)  11.2  Hematocrit (CBC)  32.6  MCV 91  MCH 31.3  MCHC 34.3  RDW 13.2  Neutrophil % 76.0  Lymphocyte % 12.7  Monocyte % 5.5  Eosinophil % 5.1  Basophil % 0.7  Neutrophil # 6.5  Lymphocyte # 1.1  Monocyte # 0.5  Eosinophil # 0.4  Basophil # 0.1 (Result(s) reported on 03 Mar 2013 at 06:27AM.)  04-Apr-14 05:12   Platelet Count (CBC) 202  WBC (CBC) 6.4  RBC (CBC)  3.77  Hemoglobin (  CBC)  11.2  Hematocrit (CBC)  34.2  MCV 91  MCH 29.8  MCHC 32.8  RDW 12.7  Neutrophil % 66.0  Lymphocyte % 18.5  Monocyte % 7.0  Eosinophil % 7.6  Basophil % 0.9  Neutrophil # 4.2  Lymphocyte # 1.2  Monocyte # 0.4  Eosinophil # 0.5  Basophil # 0.1 (Result(s) reported on 06 Mar 2013 at 05:57AM.)  08-Apr-14 04:55   Platelet Count (CBC) 229 (Result(s) reported on 10 Mar 2013 at  [facility] Patient went to surgery at the time of admission and anterior colon dissection was performed with anterior rectopexy. She did very well post op and at the dime of DC she was moving bowels with no recurrence of rectal prolapse.   Condition on Discharge Good   DISCHARGE INSTRUCTIONS HOME MEDS:  Medication Reconciliation: Patient's Home Medications at Discharge:     Medication Instructions  diovan 80 mg oral tablet  1 tab(s) orally once a day    amlodipine 5 mg oral tablet  1 tab(s)  once a day    sodium bicarbonate 650 mg oral tablet  1 tab(s)  2 times a day    renvela carbonate 800 mg oral tablet  1 tab(s) orally 3 times a day   hydralazine  10 milligram(s) injectable every 6 hours, As needed, SBP more than 175   acetaminophen-oxycodone 325 mg-5 mg oral tablet  1 tab(s) orally every 4 hours, As Needed, pain , As needed, pain     Physician's Instructions:  Home Health? No   Treatments None   Dressing Care A dry bandage has been applied to your wound.  Keep this bandage clean and dry.  Remove dressing tomorrow.  May shower.   Home Oxygen? No   Diet Regular   Dietary Supplements None   Diet Consistency Regular Consistency   Activity Limitations No exertional activity  No heavy lifting   Referrals None   Return to Work after follow up visit with MD   Time frame for Follow Up Appointment 1-2 weeks   Electronic Signatures: Vella Kohler (MD)  (Signed 08-Apr-14 12:45)  Authored: ADMISSION DATE AND DIAGNOSIS, CHIEF COMPLAINT/HPI, PERTINENT Datil MEDS, PATIENT INSTRUCTIONS   Last Updated: 08-Apr-14 12:45 by Vella Kohler (MD)

## 2015-03-25 NOTE — H&P (Signed)
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 chronic constipation and was found to have rectal prolase off and for the last 2 to three months.pt also has renal failure but not on dialysis .pt had colonoscopy done on january 28th when she was found to have rectal prolapse ,no tumor in the colon was seen  pt also has hypertension and takes medications for that.she was seen by Dr Johney Maine who did rubber band ligation recently to prevent her prolapse but it did not stop the prolapse.pt has prolapsed rectum for two days and can n ot reduce it   Past History one kidney and has hypertension   Past Medical Health Hypertension   Past Med/Surgical Hx:  HOH:   Mentally Handicapped:   Hysterectomy - Total:   Hemorrhoidectomy:   ALLERGIES:  Sulfa: Unknown  HOME MEDICATIONS: Medication Instructions Status  sodium bicarbonate 650 mg oral tablet 1 tab(s)  2 times a day  Active  amlodipine 5 mg oral tablet 1 tab(s)  once a day  Active  Diovan 80 mg oral tablet 1 tab(s) orally once a day  Active  Renvela carbonate 800 mg oral tablet 1 tab(s) orally 3 times a day Active   Family and Social History:  Family History Non-Contributory   Social History positive  tobacco   Place of Living Home   Review of Systems:  Fever/Chills No   Cough No   Sputum No   Abdominal Pain Yes   Diarrhea No   Constipation Yes   Nausea/Vomiting No   SOB/DOE No   Chest Pain No   Dysuria No   Tolerating PT Yes   Tolerating Diet Yes   Physical Exam:  GEN well nourished, thin   HEENT PERRL   NECK supple   RESP normal resp effort  clear BS  no use of accessory muscles   CARD regular rate  no murmur  No LE edema  no JVD   VASCULAR ACCESS AV fistula present   ABD denies tenderness  denies Flank Tenderness  no liver/spleen enlargement  no hernia  soft  normal BS  no Abdominal Bruits  no Adominal Mass   LYMPH  negative neck, negative axillae   EXTR negative cyanosis/clubbing, negative edema   SKIN normal to palpation, No rashes, No ulcers, skin turgor good   NEURO cranial nerves intact, motor/sensory function intact   PSYCH alert, mentally handicaped   Additional Comments pt will need surgery to reduce the rectum as she is swollen and problem would be how much dirty her colon would be might do colostomy after resection .i explaine to the sister . will need consult with the hospitalist for medical management   Lab Results: Hepatic:  29-Mar-14 22:44   Bilirubin, Total 0.3  Alkaline Phosphatase 95  SGPT (ALT) 20  SGOT (AST) 24  Total Protein, Serum 7.1  Albumin, Serum 3.5  Routine Chem:  29-Mar-14 22:44   Glucose, Serum  107  BUN  41  Creatinine (comp)  2.81  Sodium, Serum 142  Potassium, Serum 4.3  Chloride, Serum  111  CO2, Serum 26  Calcium (Total), Serum 9.0  Osmolality (calc) 294  eGFR (African American)  21  eGFR (Non-African American)  18 (eGFR values <40m/min/1.73 m2 may be an indication of chronic kidney disease (CKD). Calculated eGFR is useful in patients with stable renal function. The eGFR calculation will not be reliable in acutely ill patients when  serum creatinine is changing rapidly. It is not useful in  patients on dialysis. The eGFR calculation may not be applicable to patients at the low and high extremes of body sizes, pregnant women, and vegetarians.)  Anion Gap  5  Routine Coag:  29-Mar-14 22:44   Prothrombin 12.8  INR 0.9 (INR reference interval applies to patients on anticoagulant therapy. A single INR therapeutic range for coumarins is not optimal for all indications; however, the suggested range for most indications is 2.0 - 3.0. Exceptions to the INR Reference Range may include: Prosthetic heart valves, acute myocardial infarction, prevention of myocardial infarction, and combinations of aspirin and anticoagulant. The need for a higher or lower  target INR must be assessed individually. Reference: The Pharmacology and Management of the Vitamin K  antagonists: the seventh ACCP Conference on Antithrombotic and Thrombolytic Therapy. FBPZW.2585 Sept:126 (3suppl): [id number]. A HCT value >55% may artifactually increase the PT.  In one study,  the increase was an average of 25%. Reference:  "Effect on Routine and Special Coagulation Testing Values of Citrate Anticoagulant Adjustment in Patients with High HCT Values." American Journal of Clinical Pathology 2006;126:400-405.)  Routine Hem:  29-Mar-14 22:44   WBC (CBC) 7.4  RBC (CBC) 4.17  Hemoglobin (CBC) 12.7  Hematocrit (CBC) 37.8  Platelet Count (CBC) 191 (Result(s) reported on 28 Feb 2013 at 10:56PM.)  MCV 91  MCH 30.6  MCHC 33.7  RDW 13.4    Assessment/Admission Diagnosis pt has rectal prolapse and has chronic and for the last 2 days can not push it up   Grant to anesthesia she had water to drink when she came to emergency room  will get medicine consult and replace fluids and stabilise blood pressure  she will be prepaired for surgery today   Electronic Signatures: Vella Kohler (MD)  (Signed 30-Mar-14 03:33)  Authored: CHIEF COMPLAINT and HISTORY, PAST MEDICAL/SURGIAL HISTORY, ALLERGIES, HOME MEDICATIONS, FAMILY AND SOCIAL HISTORY, REVIEW OF SYSTEMS, PHYSICAL EXAM, LABS, ASSESSMENT AND PLAN   Last Updated: 30-Mar-14 03:33 by Vella Kohler (MD)

## 2015-03-25 NOTE — Consult Note (Signed)
Chief Complaint:  Subjective/Chief Complaint Followed by nephrology for AoCKD in setting of rectal prolapse surgery.    She is feeling well today.  Her only complaint is post op pain at incision site.  Per RN she's been doing well today with liberal oral intake and is ambulating.  Voiding in BSC.   VITAL SIGNS/ANCILLARY NOTES: **Vital Signs.:   03-Apr-14 13:30  Vital Signs Type Routine  Temperature Temperature (F) 98.1  Celsius 36.7  Temperature Source oral  Pulse Pulse 80  Respirations Respirations 18  Systolic BP Systolic BP 123XX123  Diastolic BP (mmHg) Diastolic BP (mmHg) 68  Mean BP 81  Pulse Ox % Pulse Ox % 95  Pulse Ox Activity Level  At rest  Oxygen Delivery Room Air/ 21 %  *Intake and Output.:   Daily 03-Apr-14 07:00  Grand Totals Intake:  2490 Output:  1500    Net:  990 24 Hr.:  990  Oral Intake      In:  2490  Urine ml     Out:  1500  Length of Stay Totals Intake:  8055 Output:  10150    Net:  -[id number]    Shift 15:00  Grand Totals Intake:  930 Output:  450    Net:  480 24 Hr.:  480  Oral Intake      In:  930  Urine ml     Out:  450  Length of Stay Totals Intake:  8985 Output:  10600    Net:  [id number]   Brief Assessment:  Cardiac Regular  -- LE edema  -- JVD  --Rub  --Gallop   Respiratory normal resp effort  clear BS   Gastrointestinal BS present but TTP post op   Lab Results: Hepatic:  03-Apr-14 05:07   Albumin, Serum  2.2  Routine Chem:  03-Apr-14 05:07   Glucose, Serum 88  BUN  38  Creatinine (comp)  2.94  Sodium, Serum  135  Potassium, Serum 3.9  Chloride, Serum 100  CO2, Serum 29  Calcium (Total), Serum 8.9  Phosphorus, Serum 3.7  Anion Gap  6  Osmolality (calc) 279 (Result(s) reported on 05 Mar 2013 at 05:44AM.)   Assessment/Plan:  Assessment/Plan:  Assessment 44F with mental retardation, HTN, CKD IV (BL Cr 2.3-2.8), chronic rectal prolapse who is admitted following low anterior colonic resection with rectopexy for chronic rectal  prolapse.   Plan ***Acute on Chronic kidney disease: Her baseline creatinine ranges 2.1-2.8 as an outpatient and she is slightly above this right now.  Expect she has some mild tubular injury due to moderate hypotension over the past 36 hours.  She maintains normal electrolytes and good UOP at this time.  Will check Post void residual x1 to ensure urinary retention isn't cause of AKI.   Taking good oral intake so role for IVF at this time.  Chem 10 ordered for morning.  **HTN:  BPs lower over the past day with SBP in the 110s.  No fevers and appears well.  This may be related to pain medications.  Have decreased dose of amlodipine to 5mg  daily.  If BPs even lower would recommend fluid challenge of NS 56mL bolus to see if it helps though she does not currently appear hypovolemic.   **Anemia:  Mild, no indications for epogen.  CBC ordered for morning.   **Discontinued lovenox in favor of heparin 5000u SQ TID given renal function.    Will follow along with you, please don't hesitate to contact me with any questions  or concerns. Jannifer Hick pager [phone]   Electronic Signatures: Justin Mend (MD)  (Signed 03-Apr-14 15:30)  Authored: Chief Complaint, VITAL SIGNS/ANCILLARY NOTES, Brief Assessment, Lab Results, Assessment/Plan   Last Updated: 03-Apr-14 15:30 by Justin Mend (MD)

## 2015-03-25 NOTE — Consult Note (Signed)
Chief Complaint:  Subjective/Chief Complaint Feeling well, no complaints.  Per RN taking great po intake of clears.   VITAL SIGNS/ANCILLARY NOTES: **Vital Signs.:   02-Apr-14 13:47  Vital Signs Type Routine  Temperature Temperature (F) 98.4  Celsius 36.8  Pulse Pulse 86  Respirations Respirations 18  Systolic BP Systolic BP 275  Diastolic BP (mmHg) Diastolic BP (mmHg) 70  Mean BP 82  Pulse Ox % Pulse Ox % 93  Pulse Ox Activity Level  At rest  Oxygen Delivery Room Air/ 21 %  *Intake and Output.:   Shift 02-Apr-14 15:00  Grand Totals Intake:  1290 Output:  500    Net:  790 24 Hr.:  790  Oral Intake      In:  1290  Urine ml     Out:  500  Length of Stay Totals Intake:  6855 Output:  1700    Net:  -2295   Brief Assessment:  Cardiac Regular  -- LE edema  -- JVD  --Rub  --Gallop   Respiratory normal resp effort  clear BS   Gastrointestinal decreased BS   Gastrointestinal details normal Soft  mildly TTP post surgery   Lab Results: Hepatic:  02-Apr-14 05:16   Albumin, Serum  2.4  Routine Chem:  02-Apr-14 05:16   Glucose, Serum 92  BUN  35  Creatinine (comp)  2.76  Sodium, Serum  135  Potassium, Serum 4.2  Chloride, Serum 101  CO2, Serum 30  Calcium (Total), Serum 8.7  Phosphorus, Serum 4.0  Anion Gap  4  Osmolality (calc) 278  eGFR (African American)  21  eGFR (Non-African American)  18 (eGFR values <16m/min/1.73 m2 may be an indication of chronic kidney disease (CKD). Calculated eGFR is useful in patients with stable renal function. The eGFR calculation will not be reliable in acutely ill patients when serum creatinine is changing rapidly. It is not useful in  patients on dialysis. The eGFR calculation may not be applicable to patients at the low and high extremes of body sizes, pregnant women, and vegetarians.)   Assessment/Plan:  Assessment/Plan:  Assessment 52F with mental retardation, HTN, CKD IV (BL Cr 2.3-2.8), chronic rectal prolapse who is  admitted following low anterior colonic resection with rectopexy for chronic rectal prolapse.   Plan **Chronic kidney disease:  Her renal function is at her baseline, which ranges 2.1-2.8 as an outpatient.  She has great UOP and is taking great oral intake in form of clears.  No role for IVF at this time.  Will check Chem 10 again in the morning as she's had some transient hypotension to 917/49Sand certainly could have some AKI on her CKD which would most likely be due to ATN.    **HTN:  normotensive to moderately hypotensive currently on amlodipine 159mand hydralazine 25 TID.  Her home medications are amlodipine 5 daily and diovan 80 daily.  Given the lower BPs transiently at times have discontinued her hydralazine 25 TID for now.  Will continue to follow and she had a prn hydralazine IV given SBP >200 on arrival to hospital.   **Anemia:  Mild, no indications for epogen.  **Metabolic acidosis secondary to CKD: discontined oral sodium bicarbonate given serum bicarbonate today is 30.   Will follow along with you, please don't hesitate to contact me with any questions or concerns. LiJannifer Hickager [phone] Electronic Signatures: KrJustin MendMD)  (Signed 02-Apr-14 15:13)  Authored: Chief Complaint, VITAL SIGNS/ANCILLARY NOTES, Brief Assessment, Lab Results, Assessment/Plan  Last Updated: 02-Apr-14 15:13 by Justin Mend (MD)

## 2015-03-25 NOTE — Consult Note (Signed)
General Aspect Seen in consultation for Acute on Chronic Kidney Disease   Present Illness 4F with mental retardation, HTN, CKD IV (BL Cr 2.3-2.8), chronic rectal prolapse who is admitted following low anterior colonic resection with rectopexy for chronic rectal prolapse.    She was admitted yesterday and underwent surgery which was by report uncomplicated.  Her diet has been advanced to clears and she is tolerating this well, requesting normal diet currently.   She denies any uremic symptoms.   She was recently sent for fistula study given increasing size.  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    On presentation her Cr was 2.8 but is down to 2.3 today. She complains of only mild pain at the abdominal incision site but otherwise is doing well.  Her boyfriend is at the bedside and agrees she's doing well.   Home Medications: Medication Instructions Status  sodium bicarbonate 650 mg oral tablet 1 tab(s)  2 times a day  Active  amlodipine 5 mg oral tablet 1 tab(s)  once a day  Active  Diovan 80 mg oral tablet 1 tab(s) orally once a day  Active  Renvela carbonate 800 mg oral tablet 1 tab(s) orally 3 times a day Active    Sulfa: Unknown  Case History and Physical Exam:  Chief Complaint Abdominal Pain   Past Medical Health solitary kidney, CKD IV, developmental delay, HTN, metabolic acidosis secondary to CKD   Past Surgical History s/p R BC AVF placement, s/p partial colectomy and rectopexy yesterday   Family History Non-Contributory   HEENT PERLA  edentulous, MM tacky   Neck/Nodes Supple  no JVD   Chest/Lungs Clear   Cardiovascular Normal Sinus Rhythm  II/VI syst murmur LUSB, RUE AVF with thrill and bruit - tortuous but collapses with elevation of arm   Abdomen Active bowel sounds  dressing just left of midline c/d/i   Genitalia foley with clear yellow urine   Musculoskeletal Full range of motion   Neurological poor hearing, but  alert and cheerful   Skin Warm  Dry   Nursing/Ancillary Notes: **Vital Signs.:   31-Mar-14 13:06  Vital Signs Type Routine  Temperature Temperature (F) 98.5  Celsius 36.9  Temperature Source oral  Pulse Pulse 81  Respirations Respirations 20  Systolic BP Systolic BP 222  Diastolic BP (mmHg) Diastolic BP (mmHg) 88  Mean BP 108  Pulse Ox % Pulse Ox % 96  Pulse Ox Activity Level  At rest  Oxygen Delivery Room Air/ 21 %  *Intake and Output.:   31-Mar-14 02:57  Grand Totals Intake:  2205 Output:      Net:  2205 24 Hr.:  2305  IV (Primary)      In:  2205    05:00  Grand Totals Intake:   Output:  1000    Net:  -1000 24 Hr.:  1305  Urine ml     Out:  1000  Urinary Method  Foley   Routine Chem:  31-Mar-14 05:12   Glucose, Serum  141  BUN  26  Creatinine (comp)  2.28  Sodium, Serum 139  Potassium, Serum 3.8  Chloride, Serum 107  CO2, Serum 27  Calcium (Total), Serum 8.8  Anion Gap  5  Osmolality (calc) 285  eGFR (African American)  27  eGFR (Non-African American)  23 (eGFR values <41m/min/1.73 m2 may be an indication of chronic kidney disease (CKD). Calculated eGFR is useful in patients with stable renal function.  The eGFR calculation will not be reliable in acutely ill patients when serum creatinine is changing rapidly. It is not useful in  patients on dialysis. The eGFR calculation may not be applicable to patients at the low and high extremes of body sizes, pregnant women, and vegetarians.)  Routine Hem:  31-Mar-14 05:12   WBC (CBC)  12.0  RBC (CBC)  3.68  Hemoglobin (CBC)  11.4  Hematocrit (CBC)  33.4  Platelet Count (CBC) 162  MCV 91  MCH 31.0  MCHC 34.2  RDW 13.5  Neutrophil % 88.9  Lymphocyte % 6.1  Monocyte % 4.4  Eosinophil % 0.1  Basophil % 0.5  Neutrophil #  10.7  Lymphocyte #  0.7  Monocyte # 0.5  Eosinophil # 0.0  Basophil # 0.1 (Result(s) reported on 02 Mar 2013 at [facility].)   Cardiology:    30-Mar-14 10:47, Echo Doppler  Echo  Doppler   REASON FOR EXAM:      COMMENTS:       PROCEDURE: [facility] - ECHO DOPPLER COMPLETE  - Mar 01 2013 10:47AM     RESULT: Echocardiogram Report    Patient Name:   Brianna Hamilton Date of Exam: 03/01/2013  Medical Rec #:  [id number]                  Custom1:  Date of Birth:  03/21/1956              Height:       61.0 in  Patient Age:    59 years                Weight:       121.0 lb  Patient Gender: F                       BSA:          1.53 m??    Indications: Murmur  Sonographer:    Janalee Dane RCS  Referring Phys: Vella Kohler, S    Summary:   1. Left ventricular ejection fraction, by visual estimation, is 60 to   65%.   2. Moderately dilated left atrium.   3. Moderately dilated right atrium.   4. Moderate to severe mitral valve regurgitation.   5. Mildly increased left ventricular internal cavity size.   6. Moderate tricuspid regurgitation.  2D AND M-MODE MEASUREMENTS (normal ranges within parentheses):  Left Ventricle:          Normal  IVSd (2D):      0.80 cm (0.7-1.1)  LVPWd (2D):     1.07 cm (0.7-1.1) Aorta/LA:                  Normal  LVIDd (2D):     5.36 cm (3.4-5.7) Aortic Root (2D): 3.10 cm (2.4-3.7)  LVIDs (2D):     3.22 cm           Left Atrium (2D): 4.40 cm (1.9-4.0)  LV FS (2D):     39.9 %   (>25%)  LV EF (2D):     70.1 %(>50%)                                    Right Ventricle:  RVd (2D):  SPECTRAL DOPPLER ANALYSIS (where applicable):    PHYSICIAN INTERPRETATION:  Left Ventricle: The left ventricular internal cavity size was mildly   increased. LV posterior wall thickness was normal. Left ventricular   ejection fraction, by visual estimation, is 60 to 65%.  Right Ventricle: Normal right ventricular size, wall thickness, and   systolic function. RV wall thickness is normal.  LeftAtrium: The left atrium is moderately dilated.  Right Atrium: The right atrium is moderately dilated.  Pericardium: There is no evidence of  pericardial effusion.  Mitral Valve: Moderate to severe mitral valve regurgitation is seen.  Tricuspid Valve: Moderate tricuspid regurgitation is visualized.  Aortic Valve: The aortic valve is trileaflet and structurally normal,   with normal leaflet excursion; without any evidence of aortic stenosis or   insufficiency.  Pulmonic Valve: Structurally normal pulmonic valve, with normal leaflet   excursion.  Aorta: The aortic root and ascending aorta are structurally normal, with   no evidence of dilitation.    [id number] Serafina Royals MD  Electronically signed by [id number] Serafina Royals MD  Signature Date/Time: 03/01/2013/4:26:50 PM  *** Final ***    IMPRESSION: .        Verified By: Corey Skains  ([facility]), M.D., MD    Impression [phone] with mental retardation, HTN, CKD IV (BL Cr 2.3-2.8), chronic rectal prolapse who is admitted following low anterior colonic resection with rectopexy for chronic rectal prolapse.   Plan **Chronic kidney disease:  Her renal function is at her baseline and she has great UOP.  She's at risk for hyponatremia given her CKD (impaired diluting ability) and risk for SIADH in the post op period thus have stopped her hypotonic D51/2NS.  She's maintaining oral intake so for now will avoid further IV hydration.  Should she require NPO status or develop inability to tolerate oral intake would prefer use of normal saline.   **HTN:  normotensive currently on amlodipine 46m and hydralazine 25 TID.  Her home medications are amlodipine 5 daily and diovan 80 daily.  OK to continue current medications and will rectify differences prior to discharge.  Expect she can resume her home ARB at discharge.  Added a hold parameter to the hydralazine (for SBP <140 to hold) given her amlodipine dose is double her normal dose and would like to avoid hypotension.    **Anemia:  Mild, no indications for epogen.  **Metabolic acidosis secondary to CKD: continue na bicarb 650 bid.   Will follow  along with you, please don't hesitate to contact me with any questions or concerns. LJannifer Hickpager [phone]  Electronic Signatures: KJustin Mend(MD)  (Signed 31-Mar-14 13:44)  Authored: General Aspect/Present Illness, Home Medications, Allergies, History and Physical Exam, Vital Signs, Labs, Radiology, Impression/Plan   Last Updated: 31-Mar-14 13:44 by KJustin Mend(MD)

## 2015-05-16 ENCOUNTER — Other Ambulatory Visit: Payer: Self-pay | Admitting: Physician Assistant

## 2015-05-16 ENCOUNTER — Ambulatory Visit
Admission: RE | Admit: 2015-05-16 | Discharge: 2015-05-16 | Disposition: A | Discharge status: Home / Self Care | Payer: Medicare Other | Source: Ambulatory Visit | Attending: Physician Assistant | Admitting: Physician Assistant

## 2015-05-16 DIAGNOSIS — M79605 Pain in left leg: Secondary | ICD-10-CM | POA: Diagnosis present

## 2015-05-16 DIAGNOSIS — R600 Localized edema: Secondary | ICD-10-CM

## 2015-12-23 ENCOUNTER — Other Ambulatory Visit: Payer: Self-pay | Admitting: Physician Assistant

## 2015-12-23 DIAGNOSIS — R0781 Pleurodynia: Secondary | ICD-10-CM

## 2015-12-23 DIAGNOSIS — N185 Chronic kidney disease, stage 5: Secondary | ICD-10-CM

## 2015-12-23 DIAGNOSIS — R1012 Left upper quadrant pain: Secondary | ICD-10-CM

## 2015-12-28 ENCOUNTER — Ambulatory Visit
Admission: RE | Admit: 2015-12-28 | Discharge: 2015-12-28 | Disposition: A | Discharge status: Home / Self Care | Payer: Medicare Other | Source: Ambulatory Visit | Attending: Physician Assistant | Admitting: Physician Assistant

## 2015-12-28 DIAGNOSIS — R0781 Pleurodynia: Secondary | ICD-10-CM | POA: Diagnosis not present

## 2015-12-28 DIAGNOSIS — N185 Chronic kidney disease, stage 5: Secondary | ICD-10-CM | POA: Diagnosis not present

## 2015-12-28 DIAGNOSIS — R1012 Left upper quadrant pain: Secondary | ICD-10-CM

## 2015-12-28 DIAGNOSIS — R93421 Abnormal radiologic findings on diagnostic imaging of right kidney: Secondary | ICD-10-CM | POA: Insufficient documentation

## 2016-04-15 ENCOUNTER — Emergency Department
Admission: EM | Admit: 2016-04-15 | Discharge: 2016-04-15 | Disposition: A | Discharge status: Home / Self Care | Payer: Medicare Other | Attending: Emergency Medicine | Admitting: Emergency Medicine

## 2016-04-15 DIAGNOSIS — R21 Rash and other nonspecific skin eruption: Secondary | ICD-10-CM | POA: Diagnosis present

## 2016-04-15 DIAGNOSIS — B372 Candidiasis of skin and nail: Secondary | ICD-10-CM

## 2016-04-15 DIAGNOSIS — B379 Candidiasis, unspecified: Secondary | ICD-10-CM | POA: Insufficient documentation

## 2016-04-15 MED ORDER — FLUCONAZOLE 150 MG PO TABS: 150.0000 mg | ORAL_TABLET | Freq: Every day | ORAL | Status: DC

## 2016-04-15 MED ORDER — NYSTATIN 100000 UNIT/GM EX POWD: CUTANEOUS | Status: DC

## 2016-04-15 NOTE — ED Notes (Signed)
Itchy rash under left breast and abd since Wednesday. Denies fever.

## 2016-04-15 NOTE — ED Provider Notes (Signed)
[facility] Emergency Department Provider Note  ____________________________________________  Time seen: Approximately 11:33 AM  I have reviewed the triage vital signs and the nursing notes.   HISTORY  Chief Complaint Rash    HPI Brianna Hamilton is a 60 y.o. female , NAD, presents to the emergency room with four-day history of red itchy rash. States the rash began under her left breast and has spread down her abdomen. Also notes some of the same rash about her lower abdomen and upper pelvic region. Notes the area is significantly itchy. Has not tried anything over-the-counter to alleviate the rash. States she has been sweating more recently with warmer weather. Cool showers have helped soothe itching. Denies any fever, chills, body aches. Has not been exposed to any environmental allergens. Has not had any changes in lotions, soaps, detergents, medications. Has not been exposed to anyone with similar symptoms.   No past medical history on file.  There are no active problems to display for this patient.   No past surgical history on file.  Current Outpatient Rx  Name  Route  Sig  Dispense  Refill  . fluconazole (DIFLUCAN) 150 MG tablet   Oral   Take 1 tablet (150 mg total) by mouth daily. Take one tablet today and another in 3 days if no better   2 tablet   0   . nystatin (NYSTATIN) powder      Apply thin layer to affected area up to 2 times daily as needed.   30 g   0     Allergies Review of patient's allergies indicates no known allergies.  No family history on file.  Social History Social History  Substance Use Topics  . Smoking status: Not on file  . Smokeless tobacco: Not on file  . Alcohol Use: Not on file     Review of Systems  Constitutional: No fever/chills ENT: No sore throat. Cardiovascular: No chest pain. Respiratory: No shortness of breath.  Gastrointestinal: No abdominal pain.  Musculoskeletal: Negative for General  myalgias.  Skin: Positive for rash.   ____________________________________________   PHYSICAL EXAM:  VITAL SIGNS: ED Triage Vitals  Enc Vitals Group     BP 04/15/16 1124 170/77 mmHg     Pulse Rate 04/15/16 1124 84     Resp 04/15/16 1124 20     Temp 04/15/16 1124 98.3 F (36.8 C)     Temp Source 04/15/16 1124 Oral     SpO2 04/15/16 1124 100 %     Weight 04/15/16 1124 137 lb (62.143 kg)     Height 04/15/16 1124 5' (1.524 m)     Head Cir --      Peak Flow --      Pain Score --      Pain Loc --      Pain Edu? --      Excl. in [place]? --      Constitutional: Alert and oriented. Well appearing and in no acute distress. Eyes: Conjunctivae are normal.   Head: Atraumatic. Neck: Supple with full range of motion Hematological/Lymphatic/Immunilogical: No cervical lymphadenopathy. Cardiovascular: Good peripheral circulation. Respiratory: Normal respiratory effort without tachypnea or retractions.  Gastrointestinal: Soft and nontender without distention nor guarding in all quadrants.  Neurologic:  Normal speech and language.  Skin:  Skin is warm, dry and intact. Erythematous maculopapular nonblanching rash noted about the left lower chest and left abdomen sparsely. No open wounds or other skin sores. No oozing or weeping. No vesicles. No  tenderness to palpation. Psychiatric: Mood and affect are normal. Speech and behavior are normal. Patient exhibits appropriate insight and judgement.   ____________________________________________   LABS  None ____________________________________________  EKG  None ____________________________________________  RADIOLOGY  None ____________________________________________    PROCEDURES  Procedure(s) performed: None   Medications - No data to display   ____________________________________________   INITIAL IMPRESSION / ASSESSMENT AND PLAN / ED COURSE  Patient's diagnosis is consistent with yeast infection of the skin. Patient  will be discharged home with prescriptions for Diflucan tablets and nystatin powder to take as directed. Patient advised to continue cool showers as needed. Should keep the skin cool and dry at all times. Patient is to follow up with her primary care provider if symptoms persist past this treatment course. Patient is given ED precautions to return to the ED for any worsening or new symptoms.    ____________________________________________  FINAL CLINICAL IMPRESSION(S) / ED DIAGNOSES  Final diagnoses:  Yeast infection of the skin      NEW MEDICATIONS STARTED DURING THIS VISIT:  Discharge Medication List as of 04/15/2016 11:39 AM    START taking these medications   Details  fluconazole (DIFLUCAN) 150 MG tablet Take 1 tablet (150 mg total) by mouth daily. Take one tablet today and another in 3 days if no better, Starting 04/15/2016, Until Discontinued, Print    nystatin (NYSTATIN) powder Apply thin layer to affected area up to 2 times daily as needed., Print             Braxton Feathers, PA-C 04/15/16 1220  Schuyler Amor, MD 04/15/16 1538

## 2016-04-15 NOTE — Discharge Instructions (Signed)
Cutaneous Candidiasis °Cutaneous candidiasis is a condition in which there is an overgrowth of yeast (candida) on the skin. Yeast normally live on the skin, but in small enough numbers not to cause any symptoms. In certain cases, increased growth of the yeast may cause an actual yeast infection. This kind of infection usually occurs in areas of the skin that are constantly warm and moist, such as the armpits or the groin. Yeast is the most common cause of diaper rash in babies and in people who cannot control their bowel movements (incontinence). °CAUSES  °The fungus that most often causes cutaneous candidiasis is Candida albicans. Conditions that can increase the risk of getting a yeast infection of the skin include: °· Obesity. °· Pregnancy. °· Diabetes. °· Taking antibiotic medicine. °· Taking birth control pills. °· Taking steroid medicines. °· Thyroid disease. °· An iron or zinc deficiency. °· Problems with the immune system. °SYMPTOMS  °· Red, swollen area of the skin. °· Bumps on the skin. °· Itchiness. °DIAGNOSIS  °The diagnosis of cutaneous candidiasis is usually based on its appearance. Light scrapings of the skin may also be taken and viewed under a microscope to identify the presence of yeast. °TREATMENT  °Antifungal creams may be applied to the infected skin. In severe cases, oral medicines may be needed.  °HOME CARE INSTRUCTIONS  °· Keep your skin clean and dry. °· Maintain a healthy weight. °· If you have diabetes, keep your blood sugar under control. °SEEK IMMEDIATE MEDICAL CARE IF: °· Your rash continues to spread despite treatment. °· You have a fever, chills, or abdominal pain. °  °This information is not intended to replace advice given to you by your health care provider. Make sure you discuss any questions you have with your health care provider. °  °Document Released: 08/07/2011 Document Revised: 02/11/2012 Document Reviewed: 05/23/2015 °Elsevier Interactive Patient Education ©2016 Elsevier  Inc. ° °

## 2016-07-24 ENCOUNTER — Emergency Department: Payer: Medicare Other

## 2016-07-24 ENCOUNTER — Emergency Department
Admission: EM | Admit: 2016-07-24 | Discharge: 2016-07-24 | Disposition: A | Discharge status: Home / Self Care | Payer: Medicare Other | Attending: Student | Admitting: Student

## 2016-07-24 DIAGNOSIS — S52121A Displaced fracture of head of right radius, initial encounter for closed fracture: Secondary | ICD-10-CM | POA: Diagnosis not present

## 2016-07-24 DIAGNOSIS — W010XXA Fall on same level from slipping, tripping and stumbling without subsequent striking against object, initial encounter: Secondary | ICD-10-CM | POA: Diagnosis not present

## 2016-07-24 DIAGNOSIS — I10 Essential (primary) hypertension: Secondary | ICD-10-CM | POA: Diagnosis not present

## 2016-07-24 DIAGNOSIS — Y939 Activity, unspecified: Secondary | ICD-10-CM | POA: Insufficient documentation

## 2016-07-24 DIAGNOSIS — Y92009 Unspecified place in unspecified non-institutional (private) residence as the place of occurrence of the external cause: Secondary | ICD-10-CM | POA: Insufficient documentation

## 2016-07-24 DIAGNOSIS — Y999 Unspecified external cause status: Secondary | ICD-10-CM | POA: Insufficient documentation

## 2016-07-24 DIAGNOSIS — S59901A Unspecified injury of right elbow, initial encounter: Secondary | ICD-10-CM | POA: Diagnosis present

## 2016-07-24 HISTORY — DX: Dependence on renal dialysis: Z99.2

## 2016-07-24 HISTORY — DX: Essential (primary) hypertension: I10

## 2016-07-24 MED ORDER — HYDROCODONE-ACETAMINOPHEN 5-325 MG PO TABS: 1.0000 | ORAL_TABLET | Freq: Four times a day (QID) | ORAL | 0 refills | Status: DC | PRN

## 2016-07-24 NOTE — Discharge Instructions (Signed)
Keep the arm in the sling while out of bed. Take the pain medicine as needed. Apply ice packs to reduce pain and swelling. Follow-up with Dr. Rudene Christians in 1 week for recheck.

## 2016-07-24 NOTE — ED Triage Notes (Signed)
Pt reports she fell Monday and injured her R arm, pt is also dialysis pt and has fistula in same arm

## 2016-07-24 NOTE — ED Provider Notes (Signed)
[facility] Emergency Department Provider Note ____________________________________________  Time seen: 101  I have reviewed the triage vital signs and the nursing notes.  HISTORY  Chief Complaint  Arm Injury  HPI Brianna Hamilton is a 60 y.o. female presents to the ED for evaluation of pain and disability to the right elbow after a mechanical fall at home. Patient describes she lost her footing and tripped at home, landing on her right elbow. Since that time she's had increasing pain, swelling, and difficulty with range of motion. She is a hemodialysis patient with a right upper extremity AV fistula. She denies any other injury at this time.  Past Medical History:  Diagnosis Date  . Dialysis patient ([place])   . Hypertension     There are no active problems to display for this patient.   No past surgical history on file.  Prior to Admission medications   Medication Sig Start Date End Date Taking? Authorizing Provider  fluconazole (DIFLUCAN) 150 MG tablet Take 1 tablet (150 mg total) by mouth daily. Take one tablet today and another in 3 days if no better 04/15/16   Jami L Hagler, PA-C  HYDROcodone-acetaminophen (NORCO) 5-325 MG tablet Take 1 tablet by mouth every 6 (six) hours as needed. 07/24/16   Lasonya Hubner V Bacon Caron Tardif, PA-C  nystatin (NYSTATIN) powder Apply thin layer to affected area up to 2 times daily as needed. 04/15/16   Jami L Hagler, PA-C    Allergies Review of patient's allergies indicates no known allergies.  No family history on file.  Social History Social History  Substance Use Topics  . Smoking status: Never Smoker  . Smokeless tobacco: Never Used  . Alcohol use No    Review of Systems  Constitutional: Negative for fever. Cardiovascular: Negative for chest pain. Respiratory: Negative for shortness of breath. Gastrointestinal: Negative for abdominal pain, vomiting and diarrhea. Musculoskeletal: Negative for back pain. Right  elbow pain and disability as above.  Skin: Negative for rash. Neurological: Negative for headaches, focal weakness or numbness. ____________________________________________  PHYSICAL EXAM:  VITAL SIGNS: ED Triage Vitals  Enc Vitals Group     BP 07/24/16 1916 (!) 141/78     Pulse Rate 07/24/16 1916 79     Resp 07/24/16 1916 16     Temp 07/24/16 1916 98 F (36.7 C)     Temp Source 07/24/16 1916 Oral     SpO2 07/24/16 1916 96 %     Weight 07/24/16 1912 135 lb (62 kg)     Height 07/24/16 1912 5\' 5"  (1.651 m)     Head Circumference --      Peak Flow --      Pain Score 07/24/16 1912 5     Pain Loc --      Pain Edu? --      Excl. in [place]? --     Constitutional: Alert and oriented. Well appearing and in no distress. Head: Normocephalic and atraumatic. Cardiovascular: Normal rate, regular rhythm. Normal distal pulses. RUE with AV fistula in place to the medial right elbow.  Respiratory: Normal respiratory effort.  Musculoskeletal: Right elbow with STS noted medially. Decreased right elbow flexion & extension ROM. Nontender with normal range of motion in all extremities.  Neurologic: Baseline speech and language. No gross focal neurologic deficits are appreciated. Skin:  Skin is warm, dry and intact. No rash noted. Ecchymosis noted to the right elbow.  ____________________________________________   RADIOLOGY  Right Elbow  IMPRESSION: Mildly displaced radial head fracture.  I, Akayla Brass, Dannielle Karvonen, personally viewed and evaluated these images (plain radiographs) as part of my medical decision making, as well as reviewing the written report by the radiologist. ____________________________________________  PROCEDURES  Arm sling ____________________________________________  INITIAL IMPRESSION / ASSESSMENT AND PLAN / ED COURSE  ----------------------------------------- 8:54 PM on 07/24/2016 -----------------------------------------  Spoke with Dr. Rudene Christians. He suggest sling  and self-limited ROM and office follow-up in 1 week. Patient is cleared to continue dialysis in the RUE fistula.   Initial fracture care for a closed right radial head fracture. Patient will follow-up with ortho as discussed. Prescription for hydrocodone is provided.   Clinical Course   ____________________________________________  FINAL CLINICAL IMPRESSION(S) / ED DIAGNOSES  Final diagnoses:  Radial head fracture, closed, right, initial encounter     Melvenia Needles, PA-C 07/27/16 1846    Joanne Gavel, MD 07/28/16 [phone]

## 2016-07-27 ENCOUNTER — Encounter: Payer: Self-pay | Admitting: Physician Assistant

## 2016-12-18 ENCOUNTER — Emergency Department: Payer: Medicare Other

## 2016-12-18 ENCOUNTER — Encounter
Admission: EM | Disposition: A | Discharge status: Home / Self Care | Payer: Self-pay | Source: Home / Self Care | Attending: Internal Medicine

## 2016-12-18 ENCOUNTER — Inpatient Hospital Stay: Payer: Medicare Other | Admitting: Anesthesiology

## 2016-12-18 ENCOUNTER — Inpatient Hospital Stay
Admission: EM | Admit: 2016-12-18 | Discharge: 2016-12-21 | DRG: 377 | Disposition: A | Discharge status: Home / Self Care | Payer: Medicare Other | Attending: Internal Medicine | Admitting: Internal Medicine

## 2016-12-18 DIAGNOSIS — D62 Acute posthemorrhagic anemia: Secondary | ICD-10-CM | POA: Diagnosis present

## 2016-12-18 DIAGNOSIS — T39395A Adverse effect of other nonsteroidal anti-inflammatory drugs [NSAID], initial encounter: Secondary | ICD-10-CM | POA: Diagnosis present

## 2016-12-18 DIAGNOSIS — K253 Acute gastric ulcer without hemorrhage or perforation: Secondary | ICD-10-CM

## 2016-12-18 DIAGNOSIS — Z7982 Long term (current) use of aspirin: Secondary | ICD-10-CM | POA: Diagnosis not present

## 2016-12-18 DIAGNOSIS — Z992 Dependence on renal dialysis: Secondary | ICD-10-CM

## 2016-12-18 DIAGNOSIS — D631 Anemia in chronic kidney disease: Secondary | ICD-10-CM | POA: Diagnosis present

## 2016-12-18 DIAGNOSIS — I959 Hypotension, unspecified: Secondary | ICD-10-CM | POA: Diagnosis not present

## 2016-12-18 DIAGNOSIS — R042 Hemoptysis: Secondary | ICD-10-CM | POA: Diagnosis present

## 2016-12-18 DIAGNOSIS — K254 Chronic or unspecified gastric ulcer with hemorrhage: Principal | ICD-10-CM | POA: Diagnosis present

## 2016-12-18 DIAGNOSIS — F819 Developmental disorder of scholastic skills, unspecified: Secondary | ICD-10-CM | POA: Diagnosis present

## 2016-12-18 DIAGNOSIS — K920 Hematemesis: Secondary | ICD-10-CM | POA: Diagnosis not present

## 2016-12-18 DIAGNOSIS — E875 Hyperkalemia: Secondary | ICD-10-CM | POA: Diagnosis present

## 2016-12-18 DIAGNOSIS — I120 Hypertensive chronic kidney disease with stage 5 chronic kidney disease or end stage renal disease: Secondary | ICD-10-CM | POA: Diagnosis present

## 2016-12-18 DIAGNOSIS — N186 End stage renal disease: Secondary | ICD-10-CM | POA: Diagnosis present

## 2016-12-18 DIAGNOSIS — N2581 Secondary hyperparathyroidism of renal origin: Secondary | ICD-10-CM | POA: Diagnosis present

## 2016-12-18 DIAGNOSIS — H9190 Unspecified hearing loss, unspecified ear: Secondary | ICD-10-CM | POA: Diagnosis present

## 2016-12-18 DIAGNOSIS — Z79899 Other long term (current) drug therapy: Secondary | ICD-10-CM | POA: Diagnosis not present

## 2016-12-18 DIAGNOSIS — K269 Duodenal ulcer, unspecified as acute or chronic, without hemorrhage or perforation: Secondary | ICD-10-CM | POA: Diagnosis present

## 2016-12-18 DIAGNOSIS — K922 Gastrointestinal hemorrhage, unspecified: Secondary | ICD-10-CM

## 2016-12-18 HISTORY — DX: Disorder of kidney and ureter, unspecified: N28.9

## 2016-12-18 HISTORY — PX: ESOPHAGOGASTRODUODENOSCOPY (EGD) WITH PROPOFOL: SHX5813

## 2016-12-18 LAB — CBC
HCT: 24.9 % — ABNORMAL LOW (ref 35.0–47.0)
Hemoglobin: 8.3 g/dL — ABNORMAL LOW (ref 12.0–16.0)
MCH: 31.7 pg (ref 26.0–34.0)
MCHC: 33.2 g/dL (ref 32.0–36.0)
MCV: 95.6 fL (ref 80.0–100.0)
PLATELETS: 174 10*3/uL (ref 150–440)
RBC: 2.6 MIL/uL — AB (ref 3.80–5.20)
RDW: 14.5 % (ref 11.5–14.5)
WBC: 12.7 10*3/uL — AB (ref 3.6–11.0)

## 2016-12-18 LAB — COMPREHENSIVE METABOLIC PANEL
ALK PHOS: 78 U/L (ref 38–126)
ALT: 22 U/L (ref 14–54)
AST: 23 U/L (ref 15–41)
Albumin: 3.7 g/dL (ref 3.5–5.0)
Anion gap: 11 (ref 5–15)
BILIRUBIN TOTAL: 0.7 mg/dL (ref 0.3–1.2)
BUN: 127 mg/dL — AB (ref 6–20)
CALCIUM: 9.5 mg/dL (ref 8.9–10.3)
CO2: 21 mmol/L — ABNORMAL LOW (ref 22–32)
CREATININE: 4.73 mg/dL — AB (ref 0.44–1.00)
Chloride: 105 mmol/L (ref 101–111)
GFR, EST AFRICAN AMERICAN: 10 mL/min — AB (ref >60)
GFR, EST NON AFRICAN AMERICAN: 9 mL/min — AB (ref >60)
Glucose, Bld: 108 mg/dL — ABNORMAL HIGH (ref 65–99)
Potassium: 5 mmol/L (ref 3.5–5.1)
Sodium: 137 mmol/L (ref 135–145)
Total Protein: 6.1 g/dL — ABNORMAL LOW (ref 6.5–8.1)

## 2016-12-18 LAB — APTT

## 2016-12-18 LAB — HEMOGLOBIN: Hemoglobin: 7.6 g/dL — ABNORMAL LOW (ref 12.0–16.0)

## 2016-12-18 LAB — PROTIME-INR
INR: 1.11
Prothrombin Time: 14.3 seconds (ref 11.4–15.2)

## 2016-12-18 LAB — MRSA PCR SCREENING: MRSA by PCR: NEGATIVE

## 2016-12-18 SURGERY — ESOPHAGOGASTRODUODENOSCOPY (EGD) WITH PROPOFOL
Anesthesia: General

## 2016-12-18 MED ORDER — PANTOPRAZOLE SODIUM 40 MG PO TBEC
40.0000 mg | DELAYED_RELEASE_TABLET | Freq: Two times a day (BID) | ORAL | Status: DC
  Administered 2016-12-18 – 2016-12-21 (×6): 40 mg via ORAL
  Filled 2016-12-18 (×6): qty 1

## 2016-12-18 MED ORDER — PANTOPRAZOLE SODIUM 40 MG IV SOLR: 40.0000 mg | Freq: Two times a day (BID) | INTRAVENOUS | Status: DC

## 2016-12-18 MED ORDER — FENTANYL CITRATE (PF) 100 MCG/2ML IJ SOLN
INTRAMUSCULAR | Status: DC | PRN
  Administered 2016-12-18: 100 ug via INTRAVENOUS

## 2016-12-18 MED ORDER — PROPOFOL 10 MG/ML IV BOLUS
INTRAVENOUS | Status: DC | PRN
  Administered 2016-12-18: 150 mg via INTRAVENOUS

## 2016-12-18 MED ORDER — ONDANSETRON HCL 4 MG/2ML IJ SOLN
4.0000 mg | Freq: Once | INTRAMUSCULAR | Status: AC
  Administered 2016-12-18: 4 mg via INTRAVENOUS
  Filled 2016-12-18: qty 2

## 2016-12-18 MED ORDER — ONDANSETRON HCL 4 MG/2ML IJ SOLN
INTRAMUSCULAR | Status: DC | PRN
  Administered 2016-12-18: 4 mg via INTRAVENOUS

## 2016-12-18 MED ORDER — DEXAMETHASONE SODIUM PHOSPHATE 10 MG/ML IJ SOLN
INTRAMUSCULAR | Status: AC
  Filled 2016-12-18: qty 1

## 2016-12-18 MED ORDER — SODIUM CHLORIDE 0.9 % IV SOLN
8.0000 mg/h | INTRAVENOUS | Status: DC
  Administered 2016-12-18: 8 mg/h via INTRAVENOUS
  Filled 2016-12-18 (×2): qty 80

## 2016-12-18 MED ORDER — SODIUM CHLORIDE 0.9 % IV SOLN: INTRAVENOUS | Status: DC

## 2016-12-18 MED ORDER — SUCCINYLCHOLINE CHLORIDE 200 MG/10ML IV SOSY
PREFILLED_SYRINGE | INTRAVENOUS | Status: AC
  Filled 2016-12-18: qty 10

## 2016-12-18 MED ORDER — ROCURONIUM BROMIDE 50 MG/5ML IV SOSY
PREFILLED_SYRINGE | INTRAVENOUS | Status: AC
  Filled 2016-12-18: qty 5

## 2016-12-18 MED ORDER — SUGAMMADEX SODIUM 200 MG/2ML IV SOLN
INTRAVENOUS | Status: DC | PRN
  Administered 2016-12-18: 113.4 mg via INTRAVENOUS

## 2016-12-18 MED ORDER — PHENYLEPHRINE HCL 10 MG/ML IJ SOLN
INTRAMUSCULAR | Status: DC | PRN
  Administered 2016-12-18 (×2): 120 ug via INTRAVENOUS

## 2016-12-18 MED ORDER — MIDAZOLAM HCL 2 MG/2ML IJ SOLN
INTRAMUSCULAR | Status: AC
  Filled 2016-12-18: qty 2

## 2016-12-18 MED ORDER — EPHEDRINE SULFATE 50 MG/ML IJ SOLN
INTRAMUSCULAR | Status: DC | PRN
  Administered 2016-12-18: 15 mg via INTRAVENOUS
  Administered 2016-12-18: 10 mg via INTRAVENOUS

## 2016-12-18 MED ORDER — FENTANYL CITRATE (PF) 100 MCG/2ML IJ SOLN
INTRAMUSCULAR | Status: AC
  Filled 2016-12-18: qty 2

## 2016-12-18 MED ORDER — PROPOFOL 500 MG/50ML IV EMUL
INTRAVENOUS | Status: AC
  Filled 2016-12-18: qty 50

## 2016-12-18 MED ORDER — AMLODIPINE BESYLATE 5 MG PO TABS
5.0000 mg | ORAL_TABLET | Freq: Every day | ORAL | Status: DC
  Administered 2016-12-20: 5 mg via ORAL
  Filled 2016-12-18: qty 1

## 2016-12-18 MED ORDER — SUCCINYLCHOLINE CHLORIDE 20 MG/ML IJ SOLN
INTRAMUSCULAR | Status: DC | PRN
  Administered 2016-12-18: 100 mg via INTRAVENOUS

## 2016-12-18 MED ORDER — MIDAZOLAM HCL 2 MG/2ML IJ SOLN
INTRAMUSCULAR | Status: DC | PRN
  Administered 2016-12-18: 2 mg via INTRAVENOUS

## 2016-12-18 MED ORDER — IRBESARTAN 75 MG PO TABS
75.0000 mg | ORAL_TABLET | Freq: Every day | ORAL | Status: DC
  Administered 2016-12-20: 75 mg via ORAL
  Filled 2016-12-18 (×2): qty 1

## 2016-12-18 MED ORDER — SUGAMMADEX SODIUM 200 MG/2ML IV SOLN
INTRAVENOUS | Status: AC
  Filled 2016-12-18: qty 2

## 2016-12-18 MED ORDER — DEXAMETHASONE SODIUM PHOSPHATE 4 MG/ML IJ SOLN
INTRAMUSCULAR | Status: DC | PRN
  Administered 2016-12-18: 10 mg via INTRAVENOUS

## 2016-12-18 MED ORDER — SODIUM CHLORIDE 0.9 % IV SOLN
80.0000 mg | Freq: Once | INTRAVENOUS | Status: AC
  Administered 2016-12-18: 80 mg via INTRAVENOUS
  Filled 2016-12-18 (×2): qty 80

## 2016-12-18 MED ORDER — SODIUM CHLORIDE 0.9 % IV SOLN
INTRAVENOUS | Status: DC | PRN
  Administered 2016-12-18: 15:00:00 via INTRAVENOUS

## 2016-12-18 MED ORDER — ONDANSETRON HCL 4 MG/2ML IJ SOLN
INTRAMUSCULAR | Status: AC
  Filled 2016-12-18: qty 2

## 2016-12-18 NOTE — Transfer of Care (Signed)
Immediate Anesthesia Transfer of Care Note  Patient: Brianna Hamilton  Procedure(s) Performed: Procedure(s): ESOPHAGOGASTRODUODENOSCOPY (EGD) WITH PROPOFOL (N/A)  Patient Location: PACU  Anesthesia Type:General  Level of Consciousness: sedated  Airway & Oxygen Therapy: Patient Spontanous Breathing and Patient connected to face mask oxygen  Post-op Assessment: Report given to RN and Post -op Vital signs reviewed and stable  Post vital signs: Reviewed and stable  Last Vitals:  Vitals:   12/18/16 1451 12/18/16 1528  BP: 119/69 (!) 98/32  Pulse: 93 86  Resp: 20 17  Temp: 37.4 C 37.6 C    Last Pain:  Vitals:   12/18/16 1528  TempSrc:   PainSc: Asleep         Complications: No apparent anesthesia complications

## 2016-12-18 NOTE — Progress Notes (Signed)
EGD:  3 non bleeding superficial ulcers in the stomach likely from NSAID's   Some food in the esophagus  Plan : 1. Keep head end at 45 degree elevated to avoid aspiration of food seen in esophagus 2. Clear liquid diet today and if not bleeding and cbc stable advance diet in am and likely can be discharged tomorrow if stable 3. H pylori stool antigen  4. IV protonix today and can change to oral tomorrow and continue 5. Outpatient follow up with me in 2-3 weeks 6. Repeat EGD in 6-8 weeks to check for healing of ulcers  Dr Jonathon Bellows  Gastroenterology/Hepatology Pager: [phone]

## 2016-12-18 NOTE — ED Notes (Signed)
Dr. Vicente Males at bedside

## 2016-12-18 NOTE — ED Provider Notes (Signed)
[facility] Emergency Department Provider Note  ____________________________________________  Time seen: Approximately 12:10 PM  I have reviewed the triage vital signs and the nursing notes.   HISTORY  Chief Complaint Hemoptysis    HPI Brianna Hamilton is a 61 y.o. female , not anticoagulated, ESRD on HD, presenting for hematemesis. The patient reports that in the last 24 hours, she has had 12 episodes of bloody, both bright red and coffee-ground, emesis. She denies any lightheadedness or syncope, epigastric pain, fever. She reports brown stools.   Past Medical History:  Diagnosis Date  . Dialysis patient ([place])   . Hypertension   . Renal disorder     There are no active problems to display for this patient.   Past Surgical History:  Procedure Laterality Date  . ABDOMINAL HYSTERECTOMY      Current Outpatient Rx  . Order #: [id number] Class: Print  . Order #: [id number] Class: Print  . Order #: [id number] Class: Print    Allergies Patient has no known allergies.  No family history on file.  Social History Social History  Substance Use Topics  . Smoking status: Never Smoker  . Smokeless tobacco: Never Used  . Alcohol use No    Review of Systems Constitutional: No fever/chills.No lightheadedness. No syncope. Eyes: No visual changes. ENT: No sore throat. No congestion or rhinorrhea. Cardiovascular: Denies chest pain. Denies palpitations. Respiratory: Denies shortness of breath.  No cough. Gastrointestinal: No abdominal pain.  Positive nausea, positive vomiting.  Positive hematemesis. No diarrhea.  No constipation. Brown stool. Genitourinary: Negative for dysuria. Musculoskeletal: Negative for back pain. Skin: Negative for rash. Neurological: Negative for headaches. No focal numbness, tingling or weakness.   10-point ROS otherwise negative.  ____________________________________________   PHYSICAL EXAM:  VITAL SIGNS: ED Triage  Vitals  Enc Vitals Group     BP 12/18/16 1121 137/81     Pulse Rate 12/18/16 1121 89     Resp 12/18/16 1121 17     Temp 12/18/16 1121 97.9 F (36.6 C)     Temp Source 12/18/16 1121 Oral     SpO2 12/18/16 1121 100 %     Weight 12/18/16 1123 125 lb (56.7 kg)     Height 12/18/16 1123 5' (1.524 m)     Head Circumference --      Peak Flow --      Pain Score 12/18/16 1131 3     Pain Loc --      Pain Edu? --      Excl. in [place]? --     Constitutional: Patient is alert and answering questions appropriately. She is hard of hearing, but does read lips and is able to understand my questions. Nontoxic. Eyes: Conjunctivae are normal.  EOMI. No scleral icterus. Head: Atraumatic. Nose: No congestion/rhinnorhea. Mouth/Throat: Mucous membranes are dry with dried blood on the tongue and around the lips..  Neck: No stridor.  Supple.  No meningismus. No JVD. Cardiovascular: Normal rate, regular rhythm. No murmurs, rubs or gallops.  Respiratory: Normal respiratory effort.  No accessory muscle use or retractions. Lungs CTAB.  No wheezes, rales or ronchi. Gastrointestinal: Soft, nontender and nondistended.  No guarding or rebound.  No peritoneal signs. GU: Nonthrombosed nonbleeding hemorrhoids diffusely around the rectum. No palpable internal hemorrhoids. Stool is brown but guaiac positive. Musculoskeletal: No LE edema. No ttp in the calves or palpable cords.  Negative Homan's sign. Neurologic:  A&Ox3.  Speech is clear.  Face and smile are symmetric.  EOMI.  Moves all extremities  well. Skin:  Skin is warm, dry and intact. No rash noted. Psychiatric: Mood and affect are normal. Speech and behavior are normal.  Normal judgement.  ____________________________________________   LABS (all labs ordered are listed, but only abnormal results are displayed)  Labs Reviewed  CBC - Abnormal; Notable for the following:       Result Value   WBC 12.7 (*)    RBC 2.60 (*)    Hemoglobin 8.3 (*)    HCT 24.9 (*)     All other components within normal limits  COMPREHENSIVE METABOLIC PANEL  APTT  PROTIME-INR  TYPE AND SCREEN   ____________________________________________  EKG  ED ECG REPORT I, Eula Listen, the attending physician, personally viewed and interpreted this ECG.   Date: 12/18/2016  EKG Time: 116  Rate: 90  Rhythm: normal sinus rhythm  Axis: leftward  Intervals:none  ST&T Change: + PVC; no STEMI  ____________________________________________  RADIOLOGY  No results found.  ____________________________________________   PROCEDURES  Procedure(s) performed: None  Procedures  Critical Care performed: Yes, see critical care note(s) ____________________________________________   INITIAL IMPRESSION / ASSESSMENT AND PLAN / ED COURSE  Pertinent labs & imaging results that were available during my care of the patient were reviewed by me and considered in my medical decision making (see chart for details).  61 y.o. female with ESRD on HD presenting with 12 episodes of frank hematemesis, guaiac positive with brown stool from below, no evidence of abdominal pain or fever. Overall I am concerned about an upper GI bleed, and will initiate a Protonix bolus and drip on this patient. We will not give her additional fluids given that she is hemodynamically stable and a dialysis patient who missed dialysis yesterday. Time, she has stable vital signs and a hemoglobin of 8.3; are last hemoglobin for her is from 2014, which was 13. At this time, she does not meet criteria for transfusion, but I have ordered a type and screen showed the patient's clinical condition worsen. I spoken with the GI physician on-call, who will, evaluate the patient. The patient will be admitted to the hospitalist service for further evaluation and treatment.  CRITICAL CARE Performed by: Eula Listen   Total critical care time: 45 minutes  Critical care time was exclusive of separately billable  procedures and treating other patients.  Critical care was necessary to treat or prevent imminent or life-threatening deterioration.  Critical care was time spent personally by me on the following activities: development of treatment plan with patient and/or surrogate as well as nursing, discussions with consultants, evaluation of patient's response to treatment, examination of patient, obtaining history from patient or surrogate, ordering and performing treatments and interventions, ordering and review of laboratory studies, ordering and review of radiographic studies, pulse oximetry and re-evaluation of patient's condition.  ____________________________________________  FINAL CLINICAL IMPRESSION(S) / ED DIAGNOSES  Final diagnoses:  Upper GI bleed  Hematemesis with nausea    Clinical Course       NEW MEDICATIONS STARTED DURING THIS VISIT:  New Prescriptions   No medications on file      Eula Listen, MD 12/18/16 1243

## 2016-12-18 NOTE — Op Note (Signed)
[facility] Gastroenterology Patient Name: Brianna Hamilton Procedure Date: 12/18/2016 2:55 PM MRN: [id number] Account #: [id number] Date of Birth: [birth year] Admit Type: Inpatient Age: 61 Room: [facility] ENDO ROOM 4 Gender: Female Note Status: Finalized Procedure:            Upper GI endoscopy Indications:          Hematemesis Providers:            Jonathon Bellows MD, MD Referring MD:         No Local Md, MD (Referring MD) Medicines:            Monitored Anesthesia Care Complications:        No immediate complications. Procedure:            Pre-Anesthesia Assessment:                       - Prior to the procedure, a History and Physical was                        performed, and patient medications, allergies and                        sensitivities were reviewed. The patient's tolerance of                        previous anesthesia was reviewed.                       - The risks and benefits of the procedure and the                        sedation options and risks were discussed with the                        patient. All questions were answered and informed                        consent was obtained.                       - The risks and benefits of the procedure and the                        sedation options and risks were discussed with the                        patient. All questions were answered and informed                        consent was obtained.                       - ASA Grade Assessment: IV - A patient with severe                        systemic disease that is a constant threat to life.                       After obtaining informed consent, the endoscope was  passed under direct vision. Throughout the procedure,                        the patient's blood pressure, pulse, and oxygen                        saturations were monitored continuously. The Endoscope                        was introduced through the mouth, and  advanced to the                        third part of duodenum. The upper GI endoscopy was                        accomplished with ease. The patient tolerated the                        procedure well. Findings:      Hematin (altered blood/coffee-ground-like material) was found in the       entire duodenum. The mucosa was examined after extensive washing and       appered normal      Hematin (altered blood/coffee-ground-like material) was found in the       entire examined stomach. The mucosa was examined after extensive washing       . No active bleeding seen.      Three non-bleeding superficial gastric ulcers with a clean ulcer base       (Forrest Class III) were found in the prepyloric region of the stomach.       The largest lesion was 7 mm in largest dimension. Since non bleeding and       superficial no therapy performed      The examined esophagus was normal.      Food was found in the middle third of the esophagus.      particulate matter/food seen small fragments Impression:           - Blood in the entire examined duodenum.                       - Hematin (altered blood/coffee-ground-like material)                        in the entire stomach.                       - Non-bleeding gastric ulcers with a clean ulcer base                        (Forrest Class III).                       - Normal esophagus.                       - Food in the middle third of the esophagus.                       - No specimens collected. Recommendation:       - Return patient to hospital ward for ongoing care.                       -  Clear liquid diet today.                       - Use Protonix (pantoprazole) 40 mg IV daily today.                       - If CBC stable and doing well in the morning then can                        advance diet and consider discharge later tomorrow.                       Check H pylori stool antigen                       Repeat EGD in 8 weeks while on PPI to check  for healing                        of ulcers                       Absolutely no NSAID's                       Very likely NSAID induced gastric ulcers                       - Return to my office in 2 weeks.                       - Repeat upper endoscopy in 8 weeks to evaluate the                        response to therapy. Procedure Code(s):    --- Professional ---                       [id number], Esophagogastroduodenoscopy, flexible, transoral;                        diagnostic, including collection of specimen(s) by                        brushing or washing, when performed (separate procedure) Diagnosis Code(s):    --- Professional ---                       K92.2, Gastrointestinal hemorrhage, unspecified                       K25.9, Gastric ulcer, unspecified as acute or chronic,                        without hemorrhage or perforation                       Y86.578I, Food in esophagus causing other injury,                        initial encounter                       K92.0, Hematemesis CPT copyright 2016 American Medical Association. All rights reserved. The codes  documented in this report are preliminary and upon coder review may  be revised to meet current compliance requirements. Jonathon Bellows, MD Jonathon Bellows MD, MD 12/18/2016 3:23:00 PM This report has been signed electronically. Number of Addenda: 0 Note Initiated On: 12/18/2016 2:55 PM      [facility]

## 2016-12-18 NOTE — H&P (Signed)
  Jonathon Bellows MD [address], [place], [place] [address] Phone: [phone] Fax : [phone]  Primary Care Physician:  Marinda Elk, MD Primary Gastroenterologist:  Dr. Jonathon Bellows   Pre-Procedure History & Physical: HPI:  Brianna Hamilton is a 61 y.o. female is here for an endoscopy.   Past Medical History:  Diagnosis Date  . Dialysis patient ([place])   . Hypertension   . Renal disorder     Past Surgical History:  Procedure Laterality Date  . ABDOMINAL HYSTERECTOMY      Prior to Admission medications   Medication Sig Start Date End Date Taking? Authorizing Provider  amLODipine (NORVASC) 5 MG tablet Take 5 mg by mouth daily. 12/12/16  Yes Historical Provider, MD  aspirin EC 81 MG tablet Take 81 mg by mouth daily.   Yes Historical Provider, MD  furosemide (LASIX) 20 MG tablet Take 20 mg by mouth daily.   Yes Historical Provider, MD  sevelamer carbonate (RENVELA) 800 MG tablet Take 1,600 mg by mouth 3 (three) times daily with meals. 06/27/16 06/27/17 Yes Historical Provider, MD  valsartan (DIOVAN) 80 MG tablet Take 80 mg by mouth daily. 10/27/16  Yes Historical Provider, MD  vitamin B-12 (CYANOCOBALAMIN) 1000 MCG tablet Take 1,000 mcg by mouth daily.   Yes Historical Provider, MD  HYDROcodone-acetaminophen (NORCO) 5-325 MG tablet Take 1 tablet by mouth every 6 (six) hours as needed. Patient not taking: Reported on 12/18/2016 07/24/16   Dannielle Karvonen Menshew, PA-C  nystatin (NYSTATIN) powder Apply thin layer to affected area up to 2 times daily as needed. Patient not taking: Reported on 12/18/2016 04/15/16   Jami L Hagler, PA-C    Allergies as of 12/18/2016  . (Not on File)    History reviewed. No pertinent family history.  Social History   Social History  . Marital status: Single    Spouse name: N/A  . Number of children: N/A  . Years of education: N/A   Occupational History  . Not on file.   Social History Main Topics  . Smoking status: Never Smoker    . Smokeless tobacco: Never Used  . Alcohol use No  . Drug use: Unknown  . Sexual activity: Not on file   Other Topics Concern  . Not on file   Social History Narrative  . No narrative on file    Review of Systems: See HPI, otherwise negative ROS  Physical Exam: BP 111/63   Pulse 83   Temp 97.9 F (36.6 C) (Oral)   Resp 19   Ht 5' (1.524 m)   Wt 125 lb (56.7 kg)   SpO2 98%   BMI 24.41 kg/m  General:   Alert,  pleasant and cooperative in NAD Head:  Normocephalic and atraumatic. Neck:  Supple; no masses or thyromegaly. Lungs:  Clear throughout to auscultation.    Heart:  Regular rate and rhythm. Abdomen:  Soft, nontender and nondistended. Normal bowel sounds, without guarding, and without rebound.   Neurologic:  Alert and  oriented x4;  grossly normal neurologically.  Impression/Plan: Brianna Hamilton is here for an endoscopy to be performed for upper GI bleed  Risks, benefits, limitations, and alternatives regarding  endoscopy have been reviewed with the patient.  Questions have been answered.  All parties agreeable.   Jonathon Bellows, MD  12/18/2016, 2:50 PM

## 2016-12-18 NOTE — Anesthesia Postprocedure Evaluation (Signed)
Anesthesia Post Note  Patient: Brianna Hamilton  Procedure(s) Performed: Procedure(s) (LRB): ESOPHAGOGASTRODUODENOSCOPY (EGD) WITH PROPOFOL (N/A)  Patient location during evaluation: PACU Anesthesia Type: General Level of consciousness: awake and alert and oriented Pain management: pain level controlled Vital Signs Assessment: post-procedure vital signs reviewed and stable Respiratory status: spontaneous breathing, nonlabored ventilation and respiratory function stable Cardiovascular status: blood pressure returned to baseline and stable Postop Assessment: no signs of nausea or vomiting Anesthetic complications: no     Last Vitals:  Vitals:   12/18/16 1608 12/18/16 1627  BP:  (!) 97/55  Pulse: 98 95  Resp: (!) 22 (!) 22  Temp: 37.2 C 37.2 C    Last Pain:  Vitals:   12/18/16 1627  TempSrc: Oral  PainSc: Asleep                 Jill Ruppe

## 2016-12-18 NOTE — H&P (Addendum)
[name] NAME: [name]    MR#:  [id number]  DATE OF BIRTH:  [birth year]  DATE OF ADMISSION:  12/18/2016  PRIMARY CARE PHYSICIAN: Marinda Elk, MD   REQUESTING/REFERRING PHYSICIAN: dr Mariea Clonts  CHIEF COMPLAINT:   Dark Stools, epigastric pain and bloody emesis HISTORY OF PRESENT ILLNESS:  Brianna Hamilton  is a 61 y.o. female with a known history of ESRD on hemodialysis who presents with above complaint. Patient reports over the past 24 hours she has had 12 episodes of coffee-ground emesis. She is also complaining of epigastric abdominal pain. She has also had dark-colored stools. She missed her dialysis session yesterday due to transportation however denies shortness of breath or chest pain.   PAST MEDICAL HISTORY:   Past Medical History:  Diagnosis Date  . Dialysis patient ([place])   . Hypertension   . Renal disorder     PAST SURGICAL HISTORY:   Past Surgical History:  Procedure Laterality Date  . ABDOMINAL HYSTERECTOMY      SOCIAL HISTORY:   Social History  Substance Use Topics  . Smoking status: Never Smoker  . Smokeless tobacco: Never Used  . Alcohol use No    FAMILY HISTORY:   Liver cirrhosis DRUG ALLERGIES:  No Known Allergies  REVIEW OF SYSTEMS:   Review of Systems  Constitutional: Negative.  Negative for chills, fever and malaise/fatigue.  HENT: Positive for hearing loss. Negative for ear discharge, ear pain, nosebleeds and sore throat.   Eyes: Negative.  Negative for blurred vision and pain.  Respiratory: Negative.  Negative for cough, hemoptysis, shortness of breath and wheezing.   Cardiovascular: Negative.  Negative for chest pain, palpitations and leg swelling.  Gastrointestinal: Positive for abdominal pain, melena and vomiting. Negative for blood in stool, diarrhea and nausea.  Genitourinary: Negative.  Negative for dysuria.  Musculoskeletal: Negative.  Negative for back pain.   Skin: Negative.   Neurological: Negative for dizziness, tremors, speech change, focal weakness, seizures and headaches.  Endo/Heme/Allergies: Negative.  Does not bruise/bleed easily.  Psychiatric/Behavioral: Negative.  Negative for depression, hallucinations and suicidal ideas.    MEDICATIONS AT HOME:   Valsartan 80 mg daily  Norvasc 5 mg daily  Renvela 800 mg 2 in the morning and one at supper  Vitamin B12 thousand grams daily  Tylenol 325 daily  Aspirin 81 mg daily  Lasix 20mg  daily                                    VITAL SIGNS:  Blood pressure 137/81, pulse 89, temperature 97.9 F (36.6 C), temperature source Oral, resp. rate 17, height 5' (1.524 m), weight 56.7 kg (125 lb), SpO2 100 %.  PHYSICAL EXAMINATION:   Physical Exam  Constitutional: She is oriented to person, place, and time and well-developed, well-nourished, and in no distress. No distress.  HENT:  Head: Normocephalic.  Eyes: No scleral icterus.  Neck: Normal range of motion. Neck supple. No JVD present. No tracheal deviation present.  Cardiovascular: Normal rate, regular rhythm and normal heart sounds.  Exam reveals no gallop and no friction rub.   No murmur heard. Pulmonary/Chest: Effort normal and breath sounds normal. No respiratory distress. She has no wheezes. She has no rales. She exhibits no tenderness.  Abdominal: Soft. Bowel sounds are normal. She exhibits no distension and no mass. There is tenderness. There is no rebound and no guarding.  Musculoskeletal: Normal range of motion. She exhibits no edema.  Neurological: She is alert and oriented to person, place, and time.  Skin: Skin is warm. No rash noted. No erythema.  Psychiatric: Affect and judgment normal.      LABORATORY PANEL:   CBC  Recent Labs Lab 12/18/16 1133  WBC 12.7*  HGB 8.3*  HCT 24.9*  PLT 174    ------------------------------------------------------------------------------------------------------------------  Chemistries   Recent Labs Lab 12/18/16 1133  NA 137  K 5.0  CL 105  CO2 21*  GLUCOSE 108*  BUN 127*  CREATININE 4.73*  CALCIUM 9.5  AST 23  ALT 22  ALKPHOS 78  BILITOT 0.7   ------------------------------------------------------------------------------------------------------------------  Cardiac Enzymes No results for input(s): TROPONINI in the last 168 hours. ------------------------------------------------------------------------------------------------------------------  RADIOLOGY:  No results found.  EKG:     IMPRESSION AND PLAN:   61 year old female with ESRD on hemodialysis who presents with coffee-ground emesis and melanotic stools.  1. Upper GI bleed: Discussed plan with GI consultant. Patient will undergo EGD today Continue PPI IV BID Avoid NSAIDs Hemoglobin later this evening If hemoglobin drops to less than 7 or patient becomes hemodynamically unstable and will need transfusion KUB requested for abdominal pain.   2. ESRD and hemodialysis: Nephrology consultation for dialysis  3. Essential hypertension: Continue AVAPRO and Norvasc  4. 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 Management plans discussed with the patient and sister and they are in agreement  CODE STATUS: full  TOTAL TIME TAKING CARE OF THIS PATIENT: 45 minutes.    Jennessa Trigo M.D on 12/18/2016 at 12:36 PM  Between 7am to 6pm - Pager - [phone]  After 6pm go to [email or web address] - password [email or web address] [facility]  Office  [phone]  CC: Primary care physician; Marinda Elk, MD

## 2016-12-18 NOTE — Consult Note (Addendum)
Jonathon Bellows MD  [address] [place], [place] [address] Phone: [phone] Fax : [phone]  Consultation  Referring Provider:     No ref. provider found Primary Care Physician:  Marinda Elk, MD Primary Gastroenterologist:  Dr. Vicente Males          Reason for Consultation:     GI bleed   Date of Admission:  12/18/2016 Date of Consultation:  12/18/2016         HPI:   Brianna Hamilton is a 61 y.o. female here today in the Er . She is accompanied by her sister and she is helping with the history as the patient is very hard of hearing. She says she was doing well till last night when she had about 12 episodes of bloody vomitus. She had a picture on her phone which showed a large qty of dark blood with clots in the toilet bowl. She had 3 episodes of vomiting this morning , last at 9 am which was yellow in color. The patient denies any history of blood thinner usage nor NSAID use but her sister says she knows for pretty sure that she had taken some alleve last week .   She says she had some abdominal pain yesterday but none today . Denies passage of black stool . Rectal exam in the ER was brown in color. Denies any alcohol use present or in the past . She is on dialysis and missed her session yesterday but she is not short of breath and able to lie flat on the bed,     Past Medical History:  Diagnosis Date  . Dialysis patient ([place])   . Hypertension   . Renal disorder     Past Surgical History:  Procedure Laterality Date  . ABDOMINAL HYSTERECTOMY      Prior to Admission medications   Medication Sig Start Date End Date Taking? Authorizing Provider  fluconazole (DIFLUCAN) 150 MG tablet Take 1 tablet (150 mg total) by mouth daily. Take one tablet today and another in 3 days if no better 04/15/16   Jami L Hagler, PA-C  HYDROcodone-acetaminophen (NORCO) 5-325 MG tablet Take 1 tablet by mouth every 6 (six) hours as needed. 07/24/16   Jenise V Bacon Menshew, PA-C  nystatin (NYSTATIN)  powder Apply thin layer to affected area up to 2 times daily as needed. 04/15/16   Jami L Hagler, PA-C    No family history on file.   Social History  Substance Use Topics  . Smoking status: Never Smoker  . Smokeless tobacco: Never Used  . Alcohol use No    Allergies as of 12/18/2016  . (No Known Allergies)    Review of Systems:    All systems reviewed and negative except where noted in HPI.   Physical Exam:  Vital signs in last 24 hours: Temp:  [97.9 F (36.6 C)] 97.9 F (36.6 C) (01/16 1121) Pulse Rate:  [89] 89 (01/16 1121) Resp:  [17] 17 (01/16 1121) BP: (137)/(81) 137/81 (01/16 1121) SpO2:  [100 %] 100 % (01/16 1121) Weight:  [125 lb (56.7 kg)] 125 lb (56.7 kg) (01/16 1123)   General:   Pleasant, cooperative in NAD, lying flat on her bed  Head:  Normocephalic and atraumatic. Eyes:   No icterus.   Conjunctiva pink. PERRLA. Ears:  Very hard of hearing . Neck:  Supple; no masses or thyroidomegaly Lungs: Respirations even and unlabored. Lungs clear to auscultation bilaterally.   No wheezes, crackles, or rhonchi.  Heart:  Regular  rate and rhythm;  Without murmur, clicks, rubs or gallops Abdomen:  Soft, nondistended, mild periumbilical tenderness . Normal bowel sounds. No appreciable masses or hepatomegaly.  No rebound or guarding.  Rectal:  Not performed. Neurologic:  Alert and oriented x3;  grossly normal neurologically. Skin:  Intact without significant lesions or rashes. Cervical Nodes:  No significant cervical adenopathy. Psych:  Alert and cooperative. Normal affect. Rt arms has shunts with palpable thrill.   LAB RESULTS:  Recent Labs  12/18/16 1133  WBC 12.7*  HGB 8.3*  HCT 24.9*  PLT 174   BMET  Recent Labs  12/18/16 1133  NA 137  K 5.0  CL 105  CO2 21*  GLUCOSE 108*  BUN 127*  CREATININE 4.73*  CALCIUM 9.5   LFT  Recent Labs  12/18/16 1133  PROT 6.1*  ALBUMIN 3.7  AST 23  ALT 22  ALKPHOS 78  BILITOT 0.7   PT/INR No results for  input(s): LABPROT, INR in the last 72 hours.  STUDIES: No results found.    Impression / Plan:   Brianna Hamilton is a 61 y.o. y/o female with presented to the ER with a short history of hematemesis since last night which per her history seems to have resolved. She is anemic-normocytic but hemodynamically stable. Appears she has had a severe bleed -upper GI and most likely etiology based on her history is secondary to NSAID use.   Plan : 1. NPO, IV PPI 2. Monitor CBC and transfuse as needed 3. H pylori stool antigen  4. Will plan for EGD after 2.30 pm today  5. Flat plate x ray to rule out free air under diagphragm due to mild tenderness and prior nsaid use.   I have discussed alternative options, risks & benefits,  which include, but are not limited to, bleeding, infection, perforation,respiratory complication & drug reaction.  The patient agrees with this plan & written consent will be obtained.      Thank you for involving me in the care of this patient.      LOS: 0 days   Jonathon Bellows, MD  12/18/2016, 12:38 PM

## 2016-12-18 NOTE — ED Notes (Signed)
Family member requesting to speak to RN/ MD. ED Provider at bedside.

## 2016-12-18 NOTE — Anesthesia Procedure Notes (Signed)
Procedure Name: Intubation Date/Time: 12/18/2016 3:15 PM Performed by: Nelda Marseille Pre-anesthesia Checklist: Patient identified, Patient being monitored, Timeout performed, Emergency Drugs available and Suction available Patient Re-evaluated:Patient Re-evaluated prior to inductionOxygen Delivery Method: Circle system utilized Preoxygenation: Pre-oxygenation with 100% oxygen Intubation Type: IV induction Ventilation: Mask ventilation without difficulty Laryngoscope Size: Mac and 3 Grade View: Grade I Tube type: Oral Tube size: 7.0 mm Number of attempts: 1 Airway Equipment and Method: Stylet Placement Confirmation: ETT inserted through vocal cords under direct vision,  positive ETCO2 and breath sounds checked- equal and bilateral Secured at: 21 cm Tube secured with: Tape Dental Injury: Teeth and Oropharynx as per pre-operative assessment

## 2016-12-18 NOTE — ED Notes (Signed)
Consent signed, pt taken to Endo for procedure.

## 2016-12-18 NOTE — ED Triage Notes (Signed)
Pt c/o 12 bloody emesis since last night with some epigastric pain, states  Her stools have been brown.. Pt is hearing impaired and relies on ready your lips mostly.. Pt is a/ox3, in NAD.Marland Kitchen

## 2016-12-18 NOTE — Anesthesia Preprocedure Evaluation (Addendum)
Anesthesia Evaluation  Patient identified by MRN, date of birth, ID band Patient awake    Reviewed: Allergy & Precautions, H&P , NPO status , Patient's Chart, lab work & pertinent test results, reviewed documented beta blocker date and time   History of Anesthesia Complications Negative for: history of anesthetic complications  Airway Mallampati: III  TM Distance: >3 FB Neck ROM: full    Dental no notable dental hx. (+) Edentulous Upper, Edentulous Lower   Pulmonary neg pulmonary ROS,    Pulmonary exam normal breath sounds clear to auscultation       Cardiovascular Exercise Tolerance: Good hypertension, (-) angina(-) CAD, (-) Past MI, (-) Cardiac Stents and (-) CABG Normal cardiovascular exam(-) dysrhythmias + Valvular Problems/Murmurs  Rhythm:regular Rate:Normal     Neuro/Psych  Headaches, neg Seizures negative psych ROS   GI/Hepatic negative GI ROS, Neg liver ROS,   Endo/Other  negative endocrine ROS  Renal/GU ESRF and DialysisRenal disease  negative genitourinary   Musculoskeletal   Abdominal   Peds  Hematology negative hematology ROS (+)   Anesthesia Other Findings Past Medical History: No date: Dialysis patient ([place]) No date: Hypertension No date: Renal disorder   Reproductive/Obstetrics negative OB ROS                             Anesthesia Physical Anesthesia Plan  ASA: IV  Anesthesia Plan: General ETT, Rapid Sequence and Cricoid Pressure   Post-op Pain Management:    Induction:   Airway Management Planned:   Additional Equipment:   Intra-op Plan:   Post-operative Plan:   Informed Consent: I have reviewed the patients History and Physical, chart, labs and discussed the procedure including the risks, benefits and alternatives for the proposed anesthesia with the patient or authorized representative who has indicated his/her understanding and acceptance.   Dental  Advisory Given  Plan Discussed with: Anesthesiologist, CRNA and Surgeon  Anesthesia Plan Comments:        Anesthesia Quick Evaluation

## 2016-12-18 NOTE — Anesthesia Post-op Follow-up Note (Cosign Needed)
Anesthesia QCDR form completed.        

## 2016-12-19 DIAGNOSIS — D62 Acute posthemorrhagic anemia: Secondary | ICD-10-CM | POA: Diagnosis present

## 2016-12-19 DIAGNOSIS — K269 Duodenal ulcer, unspecified as acute or chronic, without hemorrhage or perforation: Secondary | ICD-10-CM | POA: Diagnosis present

## 2016-12-19 LAB — CBC
HEMATOCRIT: 23.4 % — AB (ref 35.0–47.0)
HEMOGLOBIN: 7.7 g/dL — AB (ref 12.0–16.0)
MCH: 32 pg (ref 26.0–34.0)
MCHC: 32.7 g/dL (ref 32.0–36.0)
MCV: 98 fL (ref 80.0–100.0)
PLATELETS: 174 10*3/uL (ref 150–440)
RBC: 2.39 MIL/uL — AB (ref 3.80–5.20)
RDW: 15 % — ABNORMAL HIGH (ref 11.5–14.5)
WBC: 12.3 10*3/uL — ABNORMAL HIGH (ref 3.6–11.0)

## 2016-12-19 LAB — BASIC METABOLIC PANEL
Anion gap: 10 (ref 5–15)
BUN: 133 mg/dL — ABNORMAL HIGH (ref 6–20)
CHLORIDE: 110 mmol/L (ref 101–111)
CO2: 18 mmol/L — AB (ref 22–32)
CREATININE: 4.92 mg/dL — AB (ref 0.44–1.00)
Calcium: 9.6 mg/dL (ref 8.9–10.3)
GFR calc non Af Amer: 9 mL/min — ABNORMAL LOW (ref >60)
GFR, EST AFRICAN AMERICAN: 10 mL/min — AB (ref >60)
Glucose, Bld: 113 mg/dL — ABNORMAL HIGH (ref 65–99)
POTASSIUM: 6.1 mmol/L — AB (ref 3.5–5.1)
Sodium: 138 mmol/L (ref 135–145)

## 2016-12-19 MED ORDER — PANTOPRAZOLE SODIUM 40 MG PO TBEC: 40.0000 mg | DELAYED_RELEASE_TABLET | Freq: Two times a day (BID) | ORAL | 0 refills | Status: DC

## 2016-12-19 MED ORDER — OXYCODONE-ACETAMINOPHEN 5-325 MG PO TABS
1.0000 | ORAL_TABLET | Freq: Four times a day (QID) | ORAL | Status: DC | PRN
  Administered 2016-12-19 – 2016-12-21 (×2): 1 via ORAL
  Filled 2016-12-19 (×2): qty 1

## 2016-12-19 MED ORDER — ACETAMINOPHEN 325 MG PO TABS
650.0000 mg | ORAL_TABLET | Freq: Four times a day (QID) | ORAL | Status: DC | PRN
  Administered 2016-12-19 (×2): 650 mg via ORAL
  Filled 2016-12-19 (×2): qty 2

## 2016-12-19 MED ORDER — LIDOCAINE-PRILOCAINE 2.5-2.5 % EX CREA
TOPICAL_CREAM | Freq: Once | CUTANEOUS | Status: DC
  Filled 2016-12-19 (×3): qty 5

## 2016-12-19 MED ORDER — SEVELAMER CARBONATE 2.4 G PO PACK
4.8000 g | PACK | Freq: Three times a day (TID) | ORAL | Status: DC
  Administered 2016-12-19 – 2016-12-21 (×5): 4.8 g via ORAL
  Filled 2016-12-19 (×8): qty 2

## 2016-12-19 MED ORDER — EPOETIN ALFA 10000 UNIT/ML IJ SOLN
10000.0000 [IU] | INTRAMUSCULAR | Status: DC
  Administered 2016-12-19 – 2016-12-21 (×2): 10000 [IU] via INTRAVENOUS
  Filled 2016-12-19: qty 1

## 2016-12-19 NOTE — Progress Notes (Signed)
Post HD assessment  

## 2016-12-19 NOTE — Progress Notes (Addendum)
HD completed. Unable to UF d/t bp drop during treatment. Pt very symptomatic. Relieved with 200cc saline bolus and HOB down. BP remained low throughout rest of treatment. Total uf 300cc. Dr. Juleen China notified of bp issues. BP post HD back to baseline, currently 128/71, HR 102, RR 19, O2 100% on RA. Patient currently has no complaints other than "anxious. Ready to go, tired" Report called to Dorothyann Gibbs.

## 2016-12-19 NOTE — Progress Notes (Signed)
Pre hd assessment  

## 2016-12-19 NOTE — Discharge Instructions (Addendum)
Resume diet and activity as before ° ° °

## 2016-12-19 NOTE — Progress Notes (Signed)
Pt arrived to HD unit. Would not allow cannulation without emla cream. Advised patient that cream takes 30-45 minutes to work. Pt insists on cream. Cream obtained from [facility] RN. Applied to access.

## 2016-12-19 NOTE — Consult Note (Signed)
[facility]  CONSULT NOTE    Date: 12/19/2016                  Patient Name:  Brianna Hamilton  MRN: [id number]  DOB: 1956/07/05  Age / Sex: 61 y.o., female         PCP: Marinda Elk, MD                 Service Requesting Consult: Dr. Darvin Neighbours                 Reason for Consult: End Stage Renal Disease            History of Present Illness: Ms. Brianna Hamilton is a 61 y.o. white female with End stage renal disease on hemodialysis AVF, hypertension, anemia, learning disability, who was admitted to [facility] on 12/18/2016 for Upper GI bleed [K92.2] Hematemesis with nausea [K92.0]  Patient's sister is at bedside and assists with history taking. Last hemodialysis was Friday. She did not have transportation on Monday ([place]).   Patient with bright red blood hemetemesis. EGD done by Dr. Vicente Males finding 3 gastric ulcers.   There is some concern of NSAID abuse however patient denies this currently.    Medications: Outpatient medications: Prescriptions Prior to Admission  Medication Sig Dispense Refill Last Dose  . amLODipine (NORVASC) 5 MG tablet Take 5 mg by mouth daily.   12/18/2016 at 0800  . aspirin EC 81 MG tablet Take 81 mg by mouth daily.   12/18/2016 at 0800  . furosemide (LASIX) 20 MG tablet Take 20 mg by mouth daily.   12/18/2016 at 0800  . sevelamer carbonate (RENVELA) 800 MG tablet Take 1,600 mg by mouth 3 (three) times daily with meals.   12/17/2016 at 2000  . valsartan (DIOVAN) 80 MG tablet Take 80 mg by mouth daily.  4 12/18/2016 at 0800  . vitamin B-12 (CYANOCOBALAMIN) 1000 MCG tablet Take 1,000 mcg by mouth daily.   12/18/2016 at 0800  . HYDROcodone-acetaminophen (NORCO) 5-325 MG tablet Take 1 tablet by mouth every 6 (six) hours as needed. (Patient not taking: Reported on 12/18/2016) 15 tablet 0 Completed Course at Unknown time  . nystatin (NYSTATIN) powder Apply thin layer to affected area up to 2 times daily as needed. (Patient not taking:  Reported on 12/18/2016) 30 g 0 Completed Course at Unknown time    Current medications: Current Facility-Administered Medications  Medication Dose Route Frequency Provider Last Rate Last Dose  . acetaminophen (TYLENOL) tablet 650 mg  650 mg Oral Q6H PRN Harrie Foreman, MD   650 mg at 12/19/16 0644  . amLODipine (NORVASC) tablet 5 mg  5 mg Oral Daily Bettey Costa, MD   Stopped at 12/19/16 1040  . irbesartan (AVAPRO) tablet 75 mg  75 mg Oral Daily Bettey Costa, MD   Stopped at 12/19/16 1041  . lidocaine-prilocaine (EMLA) cream   Topical Once Lavonia Dana, MD      . oxyCODONE-acetaminophen (PERCOCET/ROXICET) 5-325 MG per tablet 1 tablet  1 tablet Oral Q6H PRN Harrie Foreman, MD      . pantoprazole (PROTONIX) EC tablet 40 mg  40 mg Oral BID Bettey Costa, MD   40 mg at 12/19/16 1047  . sevelamer carbonate (RENVELA) powder PACK 4.8 g  4.8 g Oral TID WC Lavonia Dana, MD          Allergies: No Known Allergies    Past Medical History: Past Medical History:  Diagnosis Date  .  Dialysis patient ([place])   . Hypertension   . Renal disorder      Past Surgical History: Past Surgical History:  Procedure Laterality Date  . ABDOMINAL HYSTERECTOMY       Family History: History reviewed. No pertinent family history.   Social History: Social History   Social History  . Marital status: Single    Spouse name: N/A  . Number of children: N/A  . Years of education: N/A   Occupational History  . Not on file.   Social History Main Topics  . Smoking status: Never Smoker  . Smokeless tobacco: Never Used  . Alcohol use No  . Drug use: Unknown  . Sexual activity: Not on file   Other Topics Concern  . Not on file   Social History Narrative  . No narrative on file     Review of Systems: ROS  Vital Signs: Blood pressure (!) 120/54, pulse 79, temperature 97.5 F (36.4 C), temperature source Oral, resp. rate 18, height 5' (1.524 m), weight 56.7 kg (125 lb), SpO2 100 %.  Weight  trends: Filed Weights   12/18/16 1123  Weight: 56.7 kg (125 lb)    Physical Exam: General: NAD,   Head: Normocephalic, atraumatic. Moist oral mucosal membranes  Eyes: Anicteric, PERRL  Neck: Supple, trachea midline  Lungs:  Clear to auscultation  Heart: Regular rate and rhythm  Abdomen:  Soft, nontender,   Extremities:  no peripheral edema.  Neurologic: Nonfocal, moving all four extremities  Skin: No lesions  Access: Left arm AVF     Lab results: Basic Metabolic Panel:  Recent Labs Lab 12/18/16 1133 12/19/16 0532  NA 137 138  K 5.0 6.1*  CL 105 110  CO2 21* 18*  GLUCOSE 108* 113*  BUN 127* 133*  CREATININE 4.73* 4.92*  CALCIUM 9.5 9.6    Liver Function Tests:  Recent Labs Lab 12/18/16 1133  AST 23  ALT 22  ALKPHOS 78  BILITOT 0.7  PROT 6.1*  ALBUMIN 3.7   No results for input(s): LIPASE, AMYLASE in the last 168 hours. No results for input(s): AMMONIA in the last 168 hours.  CBC:  Recent Labs Lab 12/18/16 1133 12/18/16 1943 12/19/16 0532  WBC 12.7*  --  12.3*  HGB 8.3* 7.6* 7.7*  HCT 24.9*  --  23.4*  MCV 95.6  --  98.0  PLT 174  --  174    Cardiac Enzymes: No results for input(s): CKTOTAL, CKMB, CKMBINDEX, TROPONINI in the last 168 hours.  BNP: Invalid input(s): POCBNP  CBG: No results for input(s): GLUCAP in the last 168 hours.  Microbiology: Results for orders placed or performed during the hospital encounter of 12/18/16  MRSA PCR Screening     Status: None   Collection Time: 12/18/16  9:44 PM  Result Value Ref Range Status   MRSA by PCR NEGATIVE NEGATIVE Final    Comment:        The GeneXpert MRSA Assay (FDA approved for NASAL specimens only), is one component of a comprehensive MRSA colonization surveillance program. It is not intended to diagnose MRSA infection nor to guide or monitor treatment for MRSA infections.     Coagulation Studies:  Recent Labs  12/18/16 1219  LABPROT 14.3  INR 1.11    Urinalysis: No  results for input(s): COLORURINE, LABSPEC, PHURINE, GLUCOSEU, HGBUR, BILIRUBINUR, KETONESUR, PROTEINUR, UROBILINOGEN, NITRITE, LEUKOCYTESUR in the last 72 hours.  Invalid input(s): APPERANCEUR    Imaging: Dg Chest 1 View  Result Date: 12/18/2016 CLINICAL  DATA:  Recurrent hematemesis in past 24 hours. EXAM: CHEST 1 VIEW COMPARISON:  None. FINDINGS: The heart size and mediastinal contours are within normal limits. Both lungs are clear. No evidence pleural effusion or pneumothorax. Vascular stent graft seen within the right subclavian and axillary regions. The visualized skeletal structures are unremarkable. IMPRESSION: No active disease. Electronically Signed   By: Earle Gell M.D.   On: 12/18/2016 12:49      Assessment & Plan: Ms. ERIS HANNAN is a 61 y.o. white female with End stage renal disease on hemodialysis AVF, hypertension, anemia, learning disability, who was admitted to [facility] on 12/18/2016 for Upper GI bleed [K92.2] Hematemesis with nausea [K92.0]  [facility] Nephrology/ MWF/ [facility]/ left arm AVF  1. End Stage Renal Disease: with hyperkalemia. Elevated BUN secondary to GI bleed and missed dialysis. Request hepatitis B status.  - Hemodialysis for today. Orders prepared.   2. Anemia: with chronic kidney disease and acute GI bleed. Hemoglobin 7.7 - epo with treatment  3. Secondary Hyperparathyroidism:  - restart sevelamer  4. Hypertension: blood pressure at goal.  - furosemide, irbesartan and amlodipine.   LOS: 1 Brailey Buescher 1/17/201810:48 AM

## 2016-12-19 NOTE — Progress Notes (Signed)
Pre hd info 

## 2016-12-19 NOTE — Progress Notes (Signed)
Awaiting transport for patient

## 2016-12-19 NOTE — Progress Notes (Signed)
Hd start 

## 2016-12-20 LAB — HEMOGLOBIN AND HEMATOCRIT, BLOOD
HEMATOCRIT: 24.2 % — AB (ref 35.0–47.0)
Hemoglobin: 8.1 g/dL — ABNORMAL LOW (ref 12.0–16.0)

## 2016-12-20 LAB — CBC
HCT: 19.1 % — ABNORMAL LOW (ref 35.0–47.0)
Hemoglobin: 6.5 g/dL — ABNORMAL LOW (ref 12.0–16.0)
MCH: 32.4 pg (ref 26.0–34.0)
MCHC: 33.9 g/dL (ref 32.0–36.0)
MCV: 95.5 fL (ref 80.0–100.0)
PLATELETS: 131 10*3/uL — AB (ref 150–440)
RBC: 2 MIL/uL — AB (ref 3.80–5.20)
RDW: 14.9 % — AB (ref 11.5–14.5)
WBC: 8.8 10*3/uL (ref 3.6–11.0)

## 2016-12-20 LAB — ABO/RH: ABO/RH(D): A POS

## 2016-12-20 LAB — PREPARE RBC (CROSSMATCH)

## 2016-12-20 MED ORDER — SODIUM CHLORIDE 0.9 % IV SOLN
Freq: Once | INTRAVENOUS | Status: AC
  Administered 2016-12-20: 13:00:00 via INTRAVENOUS

## 2016-12-20 NOTE — Progress Notes (Signed)
[facility]  ROUNDING NOTE   Subjective:   Hemoglobin dropped to 6.5. PRBC transfusion ordered.   Hemodialysis treatment yesterday. Tolerated treatment well. UF of 366mL  Objective:  Vital signs in last 24 hours:  Temp:  [98.1 F (36.7 C)-98.2 F (36.8 C)] 98.2 F (36.8 C) (01/18 0809) Pulse Rate:  [87-102] 87 (01/18 0809) Resp:  [14-22] 18 (01/17 2110) BP: (67-134)/(45-71) 128/59 (01/18 0809) SpO2:  [95 %-100 %] 98 % (01/18 0809) Weight:  [61 kg (134 lb 7.7 oz)-61.5 kg (135 lb 9.3 oz)] 61 kg (134 lb 7.7 oz) (01/17 1513)  Weight change: 4.8 kg (10 lb 9.3 oz) Filed Weights   12/18/16 1123 12/19/16 1148 12/19/16 1513  Weight: 56.7 kg (125 lb) 61.5 kg (135 lb 9.3 oz) 61 kg (134 lb 7.7 oz)    Intake/Output: I/O last 3 completed shifts: In: 1412.1 [P.O.:720; I.V.:692.1] Out: 1250 [Urine:950; Other:300]   Intake/Output this shift:  Total I/O In: 360 [P.O.:360] Out: -   Physical Exam: General: NAD, laying in bed  Head: Normocephalic, atraumatic. Moist oral mucosal membranes  Eyes: Anicteric, PERRL  Neck: Supple, trachea midlineM  Lungs:  Clear to auscultation  Heart: Regular rate and rhythm  Abdomen:  Soft, nontender,   Extremities: no peripheral edema.  Neurologic: Nonfocal, moving all four extremities  Skin: No lesions  Access: Left Arm AVF    Basic Metabolic Panel:  Recent Labs Lab 12/18/16 1133 12/19/16 0532  NA 137 138  K 5.0 6.1*  CL 105 110  CO2 21* 18*  GLUCOSE 108* 113*  BUN 127* 133*  CREATININE 4.73* 4.92*  CALCIUM 9.5 9.6    Liver Function Tests:  Recent Labs Lab 12/18/16 1133  AST 23  ALT 22  ALKPHOS 78  BILITOT 0.7  PROT 6.1*  ALBUMIN 3.7   No results for input(s): LIPASE, AMYLASE in the last 168 hours. No results for input(s): AMMONIA in the last 168 hours.  CBC:  Recent Labs Lab 12/18/16 1133 12/18/16 1943 12/19/16 0532 12/20/16 0420  WBC 12.7*  --  12.3* 8.8  HGB 8.3* 7.6* 7.7* 6.5*  HCT 24.9*  --   23.4* 19.1*  MCV 95.6  --  98.0 95.5  PLT 174  --  174 131*    Cardiac Enzymes: No results for input(s): CKTOTAL, CKMB, CKMBINDEX, TROPONINI in the last 168 hours.  BNP: Invalid input(s): POCBNP  CBG: No results for input(s): GLUCAP in the last 168 hours.  Microbiology: Results for orders placed or performed during the hospital encounter of 12/18/16  MRSA PCR Screening     Status: None   Collection Time: 12/18/16  9:44 PM  Result Value Ref Range Status   MRSA by PCR NEGATIVE NEGATIVE Final    Comment:        The GeneXpert MRSA Assay (FDA approved for NASAL specimens only), is one component of a comprehensive MRSA colonization surveillance program. It is not intended to diagnose MRSA infection nor to guide or monitor treatment for MRSA infections.     Coagulation Studies:  Recent Labs  12/18/16 1219  LABPROT 14.3  INR 1.11    Urinalysis: No results for input(s): COLORURINE, LABSPEC, PHURINE, GLUCOSEU, HGBUR, BILIRUBINUR, KETONESUR, PROTEINUR, UROBILINOGEN, NITRITE, LEUKOCYTESUR in the last 72 hours.  Invalid input(s): APPERANCEUR    Imaging: Dg Chest 1 View  Result Date: 12/18/2016 CLINICAL DATA:  Recurrent hematemesis in past 24 hours. EXAM: CHEST 1 VIEW COMPARISON:  None. FINDINGS: The heart size and mediastinal contours are within normal limits.  Both lungs are clear. No evidence pleural effusion or pneumothorax. Vascular stent graft seen within the right subclavian and axillary regions. The visualized skeletal structures are unremarkable. IMPRESSION: No active disease. Electronically Signed   By: Earle Gell M.D.   On: 12/18/2016 12:49     Medications:    . amLODipine  5 mg Oral Daily  . epoetin (EPOGEN/PROCRIT) injection  10,000 Units Intravenous Q M,W,F-HD  . irbesartan  75 mg Oral Daily  . lidocaine-prilocaine   Topical Once  . pantoprazole  40 mg Oral BID  . sevelamer carbonate  4.8 g Oral TID WC   acetaminophen,  oxyCODONE-acetaminophen  Assessment/ Plan:  Ms. Brianna Hamilton is a 61 y.o. white female with End stage renal disease on hemodialysis AVF, hypertension, anemia, learning disability, who was admitted to [facility] on 12/18/2016 for Upper GI bleed [K92.2] Hematemesis with nausea [K92.0]  [facility] Nephrology/ MWF/ [facility]/ left arm AVF  1. End Stage Renal Disease: with hyperkalemia. Elevated BUN secondary to GI bleed and missed dialysis  - hemodialysis treatment yesterday. Ultrafiltration limited by hypotension.  - dialysis for tomorrow.   2. Anemia: with chronic kidney disease and acute GI bleed. Hemoglobin 6.5 - epo with treatment - PRBC transfusion ordered for today  3. Secondary Hyperparathyroidism:  - sevelamer  4. Hypertension: blood pressure at goal.  - furosemide, irbesartan and amlodipine.  - hold before dialysis tomorrow.    LOS: [place], Brianna Hamilton 1/18/201811:27 AM

## 2016-12-20 NOTE — Progress Notes (Signed)
Had blood transfusion. No signs of acute distress. No signs of of adverse reactions.

## 2016-12-20 NOTE — Progress Notes (Signed)
[facility] [place] NAME: Brianna Hamilton    MR#:  [id number]  DATE OF BIRTH:  1956-08-19  SUBJECTIVE:  CHIEF COMPLAINT:   Chief Complaint  Patient presents with  . Hemoptysis   No further bleeding or melena. Tolerating clears.  REVIEW OF SYSTEMS:    Review of Systems  Constitutional: Negative for chills and fever.  HENT: Negative for sore throat.   Eyes: Negative for blurred vision, double vision and pain.  Respiratory: Negative for cough, hemoptysis, shortness of breath and wheezing.   Cardiovascular: Negative for chest pain, palpitations, orthopnea and leg swelling.  Gastrointestinal: Negative for abdominal pain, constipation, diarrhea, heartburn, nausea and vomiting.  Genitourinary: Negative for dysuria and hematuria.  Musculoskeletal: Negative for back pain and joint pain.  Skin: Negative for rash.  Neurological: Negative for sensory change, speech change, focal weakness and headaches.  Endo/Heme/Allergies: Does not bruise/bleed easily.  Psychiatric/Behavioral: Negative for depression. The patient is not nervous/anxious.     DRUG ALLERGIES:  No Known Allergies  VITALS:  Blood pressure 124/61, pulse 89, temperature 99 F (37.2 C), resp. rate 20, height 5' (1.524 m), weight 61 kg (134 lb 7.7 oz), SpO2 98 %.  PHYSICAL EXAMINATION:   Physical Exam  GENERAL:  61 y.o.-year-old patient lying in the bed with no acute distress. Deaf. Dysarthria EYES: Pupils equal, round, reactive to light and accommodation. No scleral icterus. Extraocular muscles intact.  HEENT: Head atraumatic, normocephalic. Oropharynx and nasopharynx clear.  NECK:  Supple, no jugular venous distention. No thyroid enlargement, no tenderness.  LUNGS: Normal breath sounds bilaterally, no wheezing, rales, rhonchi. No use of accessory muscles of respiration.  CARDIOVASCULAR: S1, S2 normal. No murmurs, rubs, or gallops.  ABDOMEN: Soft, nontender, nondistended.  Bowel sounds present. No organomegaly or mass.  EXTREMITIES: No cyanosis, clubbing or edema b/l.    NEUROLOGIC: Cranial nerves II through XII are intact. No focal Motor or sensory deficits b/l.   PSYCHIATRIC: The patient is alert and awake. SKIN: No obvious rash, lesion, or ulcer.   LABORATORY PANEL:   CBC  Recent Labs Lab 12/20/16 0420  WBC 8.8  HGB 6.5*  HCT 19.1*  PLT 131*   ------------------------------------------------------------------------------------------------------------------ Chemistries   Recent Labs Lab 12/18/16 1133 12/19/16 0532  NA 137 138  K 5.0 6.1*  CL 105 110  CO2 21* 18*  GLUCOSE 108* 113*  BUN 127* 133*  CREATININE 4.73* 4.92*  CALCIUM 9.5 9.6  AST 23  --   ALT 22  --   ALKPHOS 78  --   BILITOT 0.7  --    ------------------------------------------------------------------------------------------------------------------  Cardiac Enzymes No results for input(s): TROPONINI in the last 168 hours. ------------------------------------------------------------------------------------------------------------------  RADIOLOGY:  No results found.   ASSESSMENT AND PLAN:   1. Upper GI bleed due to Duodenal ulcers with acute blood loss anemia Continue PPI  BID Avoid NSAIDs  Worsening hemoglobin. We'll transfuse 1 unit packed RBC. Repeat hemoglobin level in the morning.  2. ESRD and hemodialysis: Nephrology consultation for dialysis  3. Essential hypertension: Continue AVAPRO and Norvasc  All the records are reviewed and case discussed with Care Management/Social Workerr. Management plans discussed with the patient, family and they are in agreement.  CODE STATUS: FULL  DVT Prophylaxis: SCDs  TOTAL TIME TAKING CARE OF THIS PATIENT: 35 minutes.   POSSIBLE D/C IN 1-2 DAYS, DEPENDING ON CLINICAL CONDITION.  Hillary Bow R M.D on 12/20/2016 at 1:45 PM  Between 7am to 6pm - Pager - [phone]  After 6pm go to [email or web address] -  password [email or web address] [facility]  Office  [phone]  CC: Primary care physician; Marinda Elk, MD  Note: This dictation was prepared with Dragon dictation along with smaller phrase technology. Any transcriptional errors that result from this process are unintentional.

## 2016-12-20 NOTE — Progress Notes (Signed)
[facility] [place] NAME: Copelyn Widmer    MR#:  [id number]  DATE OF BIRTH:  Mar 29, 1956  SUBJECTIVE:  CHIEF COMPLAINT:   Chief Complaint  Patient presents with  . Hemoptysis   No further bleeding or melena. Tolerating clears.  REVIEW OF SYSTEMS:    Review of Systems  Constitutional: Negative for chills and fever.  HENT: Negative for sore throat.   Eyes: Negative for blurred vision, double vision and pain.  Respiratory: Negative for cough, hemoptysis, shortness of breath and wheezing.   Cardiovascular: Negative for chest pain, palpitations, orthopnea and leg swelling.  Gastrointestinal: Negative for abdominal pain, constipation, diarrhea, heartburn, nausea and vomiting.  Genitourinary: Negative for dysuria and hematuria.  Musculoskeletal: Negative for back pain and joint pain.  Skin: Negative for rash.  Neurological: Negative for sensory change, speech change, focal weakness and headaches.  Endo/Heme/Allergies: Does not bruise/bleed easily.  Psychiatric/Behavioral: Negative for depression. The patient is not nervous/anxious.     DRUG ALLERGIES:  No Known Allergies  VITALS:  Blood pressure 124/61, pulse 89, temperature 99 F (37.2 C), resp. rate 20, height 5' (1.524 m), weight 61 kg (134 lb 7.7 oz), SpO2 98 %.  PHYSICAL EXAMINATION:   Physical Exam  GENERAL:  61 y.o.-year-old patient lying in the bed with no acute distress. Deaf. Dysarthria EYES: Pupils equal, round, reactive to light and accommodation. No scleral icterus. Extraocular muscles intact.  HEENT: Head atraumatic, normocephalic. Oropharynx and nasopharynx clear.  NECK:  Supple, no jugular venous distention. No thyroid enlargement, no tenderness.  LUNGS: Normal breath sounds bilaterally, no wheezing, rales, rhonchi. No use of accessory muscles of respiration.  CARDIOVASCULAR: S1, S2 normal. No murmurs, rubs, or gallops.  ABDOMEN: Soft, nontender, nondistended.  Bowel sounds present. No organomegaly or mass.  EXTREMITIES: No cyanosis, clubbing or edema b/l.    NEUROLOGIC: Cranial nerves II through XII are intact. No focal Motor or sensory deficits b/l.   PSYCHIATRIC: The patient is alert and awake. SKIN: No obvious rash, lesion, or ulcer.   LABORATORY PANEL:   CBC  Recent Labs Lab 12/20/16 0420  WBC 8.8  HGB 6.5*  HCT 19.1*  PLT 131*   ------------------------------------------------------------------------------------------------------------------ Chemistries   Recent Labs Lab 12/18/16 1133 12/19/16 0532  NA 137 138  K 5.0 6.1*  CL 105 110  CO2 21* 18*  GLUCOSE 108* 113*  BUN 127* 133*  CREATININE 4.73* 4.92*  CALCIUM 9.5 9.6  AST 23  --   ALT 22  --   ALKPHOS 78  --   BILITOT 0.7  --    ------------------------------------------------------------------------------------------------------------------  Cardiac Enzymes No results for input(s): TROPONINI in the last 168 hours. ------------------------------------------------------------------------------------------------------------------  RADIOLOGY:  No results found.   ASSESSMENT AND PLAN:   1. Upper GI bleed due to Duodenal ulcers with acute blood loss anemia Continue PPI  BID Avoid NSAIDs Hemoglobin tomorrow If hemoglobin drops to less than 7 or patient becomes hemodynamically unstable and will need transfusion  2. ESRD and hemodialysis: Nephrology consultation for dialysis  3. Essential hypertension: Continue AVAPRO and Norvasc  All the records are reviewed and case discussed with Care Management/Social Workerr. Management plans discussed with the patient, family and they are in agreement.  CODE STATUS: FULL  DVT Prophylaxis: SCDs  TOTAL TIME TAKING CARE OF THIS PATIENT: 35 minutes.   POSSIBLE D/C IN 1-2 DAYS, DEPENDING ON CLINICAL CONDITION.  Hillary Bow R M.D on 12/20/2016 at 1:43 PM  Between 7am to 6pm - Pager -  [phone]  After 6pm go  to [email or web address] - password [email or web address] [facility]  Office  [phone]  CC: Primary care physician; Marinda Elk, MD  Note: This dictation was prepared with Dragon dictation along with smaller phrase technology. Any transcriptional errors that result from this process are unintentional.

## 2016-12-21 ENCOUNTER — Encounter: Payer: Self-pay | Admitting: Gastroenterology

## 2016-12-21 LAB — BASIC METABOLIC PANEL
Anion gap: 9 (ref 5–15)
BUN: 60 mg/dL — AB (ref 6–20)
CALCIUM: 9.3 mg/dL (ref 8.9–10.3)
CO2: 23 mmol/L (ref 22–32)
Chloride: 109 mmol/L (ref 101–111)
Creatinine, Ser: 4.12 mg/dL — ABNORMAL HIGH (ref 0.44–1.00)
GFR calc Af Amer: 12 mL/min — ABNORMAL LOW (ref >60)
GFR, EST NON AFRICAN AMERICAN: 11 mL/min — AB (ref >60)
GLUCOSE: 86 mg/dL (ref 65–99)
Potassium: 4.9 mmol/L (ref 3.5–5.1)
Sodium: 141 mmol/L (ref 135–145)

## 2016-12-21 LAB — HEPATITIS B CORE ANTIBODY, TOTAL: HEP B C TOTAL AB: NEGATIVE

## 2016-12-21 LAB — TYPE AND SCREEN
Blood Product Expiration Date: 201801232359
ISSUE DATE / TIME: 201801181315
Unit Type and Rh: 6200

## 2016-12-21 LAB — HEMOGLOBIN: Hemoglobin: 8.6 g/dL — ABNORMAL LOW (ref 12.0–16.0)

## 2016-12-21 LAB — HEPATITIS B SURFACE ANTIBODY,QUALITATIVE: HEP B S AB: REACTIVE

## 2016-12-21 LAB — HEPATITIS B SURFACE ANTIGEN: Hepatitis B Surface Ag: NEGATIVE

## 2016-12-21 NOTE — Progress Notes (Signed)
Hemodialysis completed. 

## 2016-12-21 NOTE — Progress Notes (Signed)
Pre-hd tx 

## 2016-12-21 NOTE — Care Management (Signed)
HD information faxed to [facility] liaison.  Notified of discharge.

## 2016-12-21 NOTE — Care Management Important Message (Signed)
Important Message  Patient Details  Name: Brianna Hamilton MRN: [id number] Date of Birth: 12-Nov-1956   Medicare Important Message Given:  Yes    Beverly Sessions, RN 12/21/2016, 2:15 PM

## 2016-12-21 NOTE — Progress Notes (Signed)
Hemodialysis start 

## 2016-12-21 NOTE — Progress Notes (Signed)
Patient discharged home. Discharge summary reviewed with patient sister. IV site removed. Concerns addressed.

## 2016-12-21 NOTE — Progress Notes (Signed)
Post dialysis assessment 

## 2016-12-21 NOTE — Progress Notes (Signed)
[facility]  ROUNDING NOTE   Subjective:   Seen and examined on hemodialysis. Tolerating treatment well. UF goal of 1.5kg  Objective:  Vital signs in last 24 hours:  Temp:  [97.8 F (36.6 C)-99 F (37.2 C)] 98.2 F (36.8 C) (01/19 1030) Pulse Rate:  [76-91] 83 (01/19 1100) Resp:  [16-20] 16 (01/19 1100) BP: (107-133)/(59-78) 108/64 (01/19 1100) SpO2:  [96 %-100 %] 96 % (01/19 1100)  Weight change:  Filed Weights   12/18/16 1123 12/19/16 1148 12/19/16 1513  Weight: 56.7 kg (125 lb) 61.5 kg (135 lb 9.3 oz) 61 kg (134 lb 7.7 oz)    Intake/Output: I/O last 3 completed shifts: In: 161 [P.O.:600; Blood:274] Out: -    Intake/Output this shift:  Total I/O In: 240 [P.O.:240] Out: -   Physical Exam: General: NAD, laying in bed  Head: Normocephalic, atraumatic. Moist oral mucosal membranes  Eyes: Anicteric, PERRL  Neck: Supple, trachea midlineM  Lungs:  Clear to auscultation  Heart: Regular rate and rhythm  Abdomen:  Soft, nontender,   Extremities: no peripheral edema.  Neurologic: Nonfocal, moving all four extremities  Skin: No lesions  Access: Left Arm AVF    Basic Metabolic Panel:  Recent Labs Lab 12/18/16 1133 12/19/16 0532 12/21/16 0557  NA 137 138 141  K 5.0 6.1* 4.9  CL 105 110 109  CO2 21* 18* 23  GLUCOSE 108* 113* 86  BUN 127* 133* 60*  CREATININE 4.73* 4.92* 4.12*  CALCIUM 9.5 9.6 9.3    Liver Function Tests:  Recent Labs Lab 12/18/16 1133  AST 23  ALT 22  ALKPHOS 78  BILITOT 0.7  PROT 6.1*  ALBUMIN 3.7   No results for input(s): LIPASE, AMYLASE in the last 168 hours. No results for input(s): AMMONIA in the last 168 hours.  CBC:  Recent Labs Lab 12/18/16 1133 12/18/16 1943 12/19/16 0532 12/20/16 0420 12/20/16 1832 12/21/16 0557  WBC 12.7*  --  12.3* 8.8  --   --   HGB 8.3* 7.6* 7.7* 6.5* 8.1* 8.6*  HCT 24.9*  --  23.4* 19.1* 24.2*  --   MCV 95.6  --  98.0 95.5  --   --   PLT 174  --  174 131*  --   --      Cardiac Enzymes: No results for input(s): CKTOTAL, CKMB, CKMBINDEX, TROPONINI in the last 168 hours.  BNP: Invalid input(s): POCBNP  CBG: No results for input(s): GLUCAP in the last 168 hours.  Microbiology: Results for orders placed or performed during the hospital encounter of 12/18/16  MRSA PCR Screening     Status: None   Collection Time: 12/18/16  9:44 PM  Result Value Ref Range Status   MRSA by PCR NEGATIVE NEGATIVE Final    Comment:        The GeneXpert MRSA Assay (FDA approved for NASAL specimens only), is one component of a comprehensive MRSA colonization surveillance program. It is not intended to diagnose MRSA infection nor to guide or monitor treatment for MRSA infections.     Coagulation Studies:  Recent Labs  12/18/16 1219  LABPROT 14.3  INR 1.11    Urinalysis: No results for input(s): COLORURINE, LABSPEC, PHURINE, GLUCOSEU, HGBUR, BILIRUBINUR, KETONESUR, PROTEINUR, UROBILINOGEN, NITRITE, LEUKOCYTESUR in the last 72 hours.  Invalid input(s): APPERANCEUR    Imaging: No results found.   Medications:    . amLODipine  5 mg Oral Daily  . epoetin (EPOGEN/PROCRIT) injection  10,000 Units Intravenous Q M,W,F-HD  . irbesartan  75 mg Oral Daily  . lidocaine-prilocaine   Topical Once  . pantoprazole  40 mg Oral BID  . sevelamer carbonate  4.8 g Oral TID WC   acetaminophen, oxyCODONE-acetaminophen  Assessment/ Plan:  Ms. Brianna Hamilton is a 61 y.o. white female with End stage renal disease on hemodialysis AVF, hypertension, anemia, learning disability, who was admitted to [facility] on 12/18/2016 for Upper GI bleed [K92.2] Hematemesis with nausea [K92.0]  [facility]/ MWF/ [facility]/ left arm AVF  1. End Stage Renal Disease: with hyperkalemia. Elevated BUN secondary to GI bleed and missed dialysis  Seen and examined on hemodialysis. Tolerating treatment well.   2. Anemia: with chronic kidney disease and acute GI bleed.  Hemoglobin 8.6 - epo with treatment - PRBC transfusion 1/18  3. Secondary Hyperparathyroidism:  - sevelamer  4. Hypertension: blood pressure at goal.  - furosemide, irbesartan and amlodipine.  - hold before dialysis    LOS: 3 Travin Marik 1/19/201811:25 AM

## 2016-12-21 NOTE — Progress Notes (Signed)
[place] rounding the unit was asked by Pt's sister to visit the Pt. Pt appeared calm and talked about being read to go home. Pt talked about her family and requested prayers for healing, which the [facility] provided. Pt's discharge today.      12/21/16 1200  Clinical Encounter Type  Visited With Patient;Family  Visit Type Initial;Spiritual support  Referral From Family  Consult/Referral To Chaplain  Spiritual Encounters  Spiritual Needs Prayer

## 2016-12-25 NOTE — Discharge Summary (Signed)
[facility] [place] NAME: Brianna Hamilton    MR#:  [id number]  DATE OF BIRTH:  [birth year]  DATE OF ADMISSION:  12/18/2016 ADMITTING PHYSICIAN: Bettey Costa, MD  DATE OF DISCHARGE: 12/21/2016  2:39 PM  PRIMARY CARE PHYSICIAN: MCLAUGHLIN, MIRIAM K, MD   ADMISSION DIAGNOSIS:  Upper GI bleed [K92.2] Hematemesis with nausea [K92.0]  DISCHARGE DIAGNOSIS:  Active Problems:   GIB (gastrointestinal bleeding)   Duodenal ulcer   Acute blood loss anemia   SECONDARY DIAGNOSIS:   Past Medical History:  Diagnosis Date  . Dialysis patient ([place])   . Hypertension   . Renal disorder      ADMITTING HISTORY  HISTORY OF PRESENT ILLNESS:  Brianna Hamilton  is a 61 y.o. female with a known history of ESRD on hemodialysis who presents with above complaint. Patient reports over the past 24 hours she has had 12 episodes of coffee-ground emesis. She is also complaining of epigastric abdominal pain. She has also had dark-colored stools. She missed her dialysis session yesterday due to transportation however denies shortness of breath or chest pain.   HOSPITAL COURSE:   * Upper GI bleed due to multiple duodenal ulcers with acute blood loss anemia She was admitted onto medical floor with IV Protonix. She did have acute blood loss anemia with drop in hemoglobin. Had blood transfusion done. After which her hemoglobins remained stable. Was seen by GI and EGD showed multiple nonbleeding duodenal ulcers. Patient will continue Protonix twice a day. Her ulcers are thought to be due to NSAID use for which patient has been counseled to quit them.  * Incisional disease on hemodialysis was continued on aspirin nephrology through the hospital stay.  Stable for discharge home.  CONSULTS OBTAINED:  Treatment Team:  Lavonia Dana, MD Jonathon Bellows, MD  DRUG ALLERGIES:  No Known Allergies  DISCHARGE MEDICATIONS:   Discharge Medication List as of 12/21/2016  2:13 PM     START taking these medications   Details  pantoprazole (PROTONIX) 40 MG tablet Take 1 tablet (40 mg total) by mouth 2 (two) times daily before a meal., Starting Wed 12/19/2016, Normal      CONTINUE these medications which have NOT CHANGED   Details  amLODipine (NORVASC) 5 MG tablet Take 5 mg by mouth daily., Starting Wed 12/12/2016, Historical Med    furosemide (LASIX) 20 MG tablet Take 20 mg by mouth daily., Historical Med    sevelamer carbonate (RENVELA) 800 MG tablet Take 1,600 mg by mouth 3 (three) times daily with meals., Starting Wed 06/27/2016, Until Thu 06/27/2017, Historical Med    valsartan (DIOVAN) 80 MG tablet Take 80 mg by mouth daily., Starting Sat 10/27/2016, Historical Med    vitamin B-12 (CYANOCOBALAMIN) 1000 MCG tablet Take 1,000 mcg by mouth daily., Historical Med    nystatin (NYSTATIN) powder Apply thin layer to affected area up to 2 times daily as needed., Print      STOP taking these medications     aspirin EC 81 MG tablet      HYDROcodone-acetaminophen (NORCO) 5-325 MG tablet         Today   VITAL SIGNS:  Blood pressure (!) 101/56, pulse 87, temperature 98.1 F (36.7 C), temperature source Oral, resp. rate 18, height 5' (1.524 m), weight 61 kg (134 lb 7.7 oz), SpO2 95 %.  I/O:  No intake or output data in the 24 hours ending 12/25/16 1354  PHYSICAL EXAMINATION:  Physical Exam  GENERAL:  61  y.o.-year-old patient lying in the bed with no acute distress.  LUNGS: Normal breath sounds bilaterally, no wheezing, rales,rhonchi or crepitation. No use of accessory muscles of respiration.  CARDIOVASCULAR: S1, S2 normal. No murmurs, rubs, or gallops.  ABDOMEN: Soft, non-tender, non-distended. Bowel sounds present. No organomegaly or mass.  NEUROLOGIC: Moves all 4 extremities. PSYCHIATRIC: The patient is alert and oriented x 3.  SKIN: No obvious rash, lesion, or ulcer.   DATA REVIEW:   CBC  Recent Labs Lab 12/20/16 0420 12/20/16 1832 12/21/16 0557   WBC 8.8  --   --   HGB 6.5* 8.1* 8.6*  HCT 19.1* 24.2*  --   PLT 131*  --   --     Chemistries   Recent Labs Lab 12/21/16 0557  NA 141  K 4.9  CL 109  CO2 23  GLUCOSE 86  BUN 60*  CREATININE 4.12*  CALCIUM 9.3    Cardiac Enzymes No results for input(s): TROPONINI in the last 168 hours.  Microbiology Results  Results for orders placed or performed during the hospital encounter of 12/18/16  MRSA PCR Screening     Status: None   Collection Time: 12/18/16  9:44 PM  Result Value Ref Range Status   MRSA by PCR NEGATIVE NEGATIVE Final    Comment:        The GeneXpert MRSA Assay (FDA approved for NASAL specimens only), is one component of a comprehensive MRSA colonization surveillance program. It is not intended to diagnose MRSA infection nor to guide or monitor treatment for MRSA infections.     RADIOLOGY:  No results found.  Follow up with PCP in 1 week.  Management plans discussed with the patient, family and they are in agreement.  CODE STATUS:  Code Status History    Date Active Date Inactive Code Status Order ID Comments User Context   12/18/2016  4:33 PM 12/21/2016  5:44 PM Full Code [id number]  Bettey Costa, MD Inpatient    Advance Directive Documentation   Flowsheet Row Most Recent Value  Type of Advance Directive  Healthcare Power of Attorney  Pre-existing out of facility DNR order (yellow form or pink MOST form)  No data  "MOST" Form in Place?  No data      TOTAL TIME TAKING CARE OF THIS PATIENT ON DAY OF DISCHARGE: more than 30 minutes.   Hillary Bow R M.D on 12/25/2016 at 1:54 PM  Between 7am to 6pm - Pager - [phone]  After 6pm go to [email or web address] - password [email or web address] [facility]  Office  [phone]  CC: Primary care physician; Marinda Elk, MD  Note: This dictation was prepared with Dragon dictation along with smaller phrase technology. Any transcriptional errors that result from this process are  unintentional.

## 2017-01-09 ENCOUNTER — Inpatient Hospital Stay: Payer: Self-pay | Admitting: Gastroenterology

## 2017-01-21 ENCOUNTER — Other Ambulatory Visit: Payer: Self-pay | Admitting: *Deleted

## 2017-01-22 ENCOUNTER — Encounter: Payer: Self-pay | Admitting: Gastroenterology

## 2017-01-22 ENCOUNTER — Ambulatory Visit (INDEPENDENT_AMBULATORY_CARE_PROVIDER_SITE_OTHER): Payer: Medicare Other | Admitting: Gastroenterology

## 2017-01-22 VITALS — BP 136/82 | HR 71 | Ht 60.0 in | Wt 137.0 lb

## 2017-01-22 DIAGNOSIS — K259 Gastric ulcer, unspecified as acute or chronic, without hemorrhage or perforation: Secondary | ICD-10-CM

## 2017-01-22 NOTE — Progress Notes (Signed)
Primary Care Physician: Marinda Elk, MD  Primary Gastroenterologist:  Dr. Jonathon Bellows   Chief Complaint  Patient presents with  . Hospitalization Follow-up    HPI: Brianna Hamilton is a 61 y.o. female here for follow up.  She is here today for a hospital follow up . I was consulted when she was admitted to the hospital on 12/18/16 for hematemesis with a prior history of using Alleve. I performed an EGD and found 3 non bleeding superficial ulcers in her stomach which I felt were likely from NSAID use.   Interval history 12/2016-01/2017 She is doing well, has brown stool . Not on any NSAID's. Her only complaint is some cough . She has completed her course of PPI. No abdominal pain.    Current Outpatient Prescriptions  Medication Sig Dispense Refill  . acetaminophen (TYLENOL) 325 MG tablet Take 325 mg by mouth.    Marland Kitchen amLODipine (NORVASC) 5 MG tablet Take 5 mg by mouth daily.    Marland Kitchen ethyl chloride spray Apply topically.    . furosemide (LASIX) 20 MG tablet Take 20 mg by mouth daily.    Marland Kitchen nystatin (NYSTATIN) powder Apply thin layer to affected area up to 2 times daily as needed. 30 g 0  . pantoprazole (PROTONIX) 40 MG tablet Take 1 tablet (40 mg total) by mouth 2 (two) times daily before a meal. 60 tablet 0  . sevelamer carbonate (RENVELA) 800 MG tablet Take 1,600 mg by mouth 3 (three) times daily with meals.    . sodium bicarbonate 650 MG tablet TAKE 1 TABLET BY MOUTH TWICE A DAY    . valsartan (DIOVAN) 80 MG tablet Take 80 mg by mouth daily.  4  . vitamin B-12 (CYANOCOBALAMIN) 1000 MCG tablet Take 1,000 mcg by mouth daily.     No current facility-administered medications for this visit.     Allergies as of 01/22/2017 - Review Complete 01/22/2017  Allergen Reaction Noted  . Sulfa antibiotics Other (See Comments) 11/30/2014    ROS:  General: Negative for anorexia, weight loss, fever, chills, fatigue, weakness. ENT: Negative for hoarseness, difficulty swallowing ,  nasal congestion. CV: Negative for chest pain, angina, palpitations, dyspnea on exertion, peripheral edema.  Respiratory: Negative for dyspnea at rest, dyspnea on exertion, cough, sputum, wheezing.  GI: See history of present illness. GU:  Negative for dysuria, hematuria, urinary incontinence, urinary frequency, nocturnal urination.  Endo: Negative for unusual weight change.    Physical Examination:   There were no vitals taken for this visit.  General: Well-nourished, well-developed in no acute distress.  Eyes: No icterus. Conjunctivae pink. Mouth: Oropharyngeal mucosa moist and pink , no lesions erythema or exudate. Lungs: Clear to auscultation bilaterally. Non-labored. Heart: Regular rate and rhythm, no murmurs rubs or gallops.  Abdomen: Bowel sounds are normal, nontender, nondistended, no hepatosplenomegaly or masses, no abdominal bruits or hernia , no rebound or guarding.   Extremities: No lower extremity edema. No clubbing or deformities. Neuro: Alert and oriented x 3.  Grossly intact. Skin: Warm and dry, no jaundice.   Psych: Alert and cooperative, normal mood and affect.  Labs:  CBC Latest Ref Rng & Units 12/21/2016 12/20/2016 12/20/2016  WBC 3.6 - 11.0 K/uL - - 8.8  Hemoglobin 12.0 - 16.0 g/dL 8.6(L) 8.1(L) 6.5(L)  Hematocrit 35.0 - 47.0 % - 24.2(L) 19.1(L)  Platelets 150 - 440 K/uL - - 131(L)    Imaging Studies: No results found.  Assessment and Plan:   Brianna Hew  Hamilton is a 61 y.o. y/o female here for follow up of gastric ulcers likely secondary to use of NSAID's.    Plan   1. EGD in 2-4 weeks to check for healing of gastric ulcers.  2. Stool for H pylori  3. No use of NSAID's  Dr Jonathon Bellows  MD

## 2017-01-23 ENCOUNTER — Other Ambulatory Visit: Payer: Self-pay

## 2017-01-23 DIAGNOSIS — K30 Functional dyspepsia: Secondary | ICD-10-CM | POA: Insufficient documentation

## 2017-01-23 DIAGNOSIS — K253 Acute gastric ulcer without hemorrhage or perforation: Secondary | ICD-10-CM

## 2017-01-23 DIAGNOSIS — Q600 Renal agenesis, unilateral: Secondary | ICD-10-CM | POA: Insufficient documentation

## 2017-01-23 DIAGNOSIS — I10 Essential (primary) hypertension: Secondary | ICD-10-CM | POA: Insufficient documentation

## 2017-02-20 ENCOUNTER — Other Ambulatory Visit
Admission: RE | Admit: 2017-02-20 | Discharge: 2017-02-20 | Disposition: A | Discharge status: Home / Self Care | Payer: Medicare Other | Source: Ambulatory Visit | Attending: Gastroenterology | Admitting: Gastroenterology

## 2017-02-20 DIAGNOSIS — K259 Gastric ulcer, unspecified as acute or chronic, without hemorrhage or perforation: Secondary | ICD-10-CM | POA: Insufficient documentation

## 2017-02-20 LAB — CBC WITH DIFFERENTIAL/PLATELET
Basophils Absolute: 0.1 10*3/uL (ref 0–0.1)
Basophils Relative: 1 %
Eosinophils Absolute: 0.3 10*3/uL (ref 0–0.7)
Eosinophils Relative: 5 %
HEMATOCRIT: 41.2 % (ref 35.0–47.0)
HEMOGLOBIN: 13.8 g/dL (ref 12.0–16.0)
LYMPHS ABS: 1.7 10*3/uL (ref 1.0–3.6)
Lymphocytes Relative: 29 %
MCH: 31.9 pg (ref 26.0–34.0)
MCHC: 33.6 g/dL (ref 32.0–36.0)
MCV: 95.2 fL (ref 80.0–100.0)
MONO ABS: 0.2 10*3/uL (ref 0.2–0.9)
MONOS PCT: 4 %
NEUTROS ABS: 3.8 10*3/uL (ref 1.4–6.5)
Neutrophils Relative %: 61 %
Platelets: 179 10*3/uL (ref 150–440)
RBC: 4.33 MIL/uL (ref 3.80–5.20)
RDW: 15.9 % — ABNORMAL HIGH (ref 11.5–14.5)
WBC: 6.1 10*3/uL (ref 3.6–11.0)

## 2017-02-22 ENCOUNTER — Other Ambulatory Visit
Admission: RE | Admit: 2017-02-22 | Discharge: 2017-02-22 | Disposition: A | Discharge status: Home / Self Care | Payer: Medicare Other | Source: Ambulatory Visit | Attending: Gastroenterology | Admitting: Gastroenterology

## 2017-02-22 DIAGNOSIS — K259 Gastric ulcer, unspecified as acute or chronic, without hemorrhage or perforation: Secondary | ICD-10-CM | POA: Insufficient documentation

## 2017-02-26 LAB — H. PYLORI ANTIGEN, STOOL: H. PYLORI STOOL AG, EIA: POSITIVE — AB

## 2017-02-27 ENCOUNTER — Telehealth: Payer: Self-pay

## 2017-02-27 NOTE — Telephone Encounter (Signed)
Unable to lvm for results.

## 2017-02-27 NOTE — Telephone Encounter (Signed)
-----   Message from Jonathon Bellows, MD sent at 02/26/2017  4:46 PM EDT ----- Hb normal now

## 2017-02-28 ENCOUNTER — Encounter: Payer: Self-pay | Admitting: *Deleted

## 2017-02-28 ENCOUNTER — Encounter
Admission: RE | Disposition: A | Discharge status: Home / Self Care | Payer: Self-pay | Source: Ambulatory Visit | Attending: Gastroenterology

## 2017-02-28 ENCOUNTER — Ambulatory Visit: Payer: Medicare Other | Admitting: Anesthesiology

## 2017-02-28 ENCOUNTER — Telehealth: Payer: Self-pay

## 2017-02-28 ENCOUNTER — Ambulatory Visit
Admission: RE | Admit: 2017-02-28 | Discharge: 2017-02-28 | Disposition: A | Discharge status: Home / Self Care | Payer: Medicare Other | Source: Ambulatory Visit | Attending: Gastroenterology | Admitting: Gastroenterology

## 2017-02-28 DIAGNOSIS — Z992 Dependence on renal dialysis: Secondary | ICD-10-CM | POA: Insufficient documentation

## 2017-02-28 DIAGNOSIS — N186 End stage renal disease: Secondary | ICD-10-CM | POA: Diagnosis not present

## 2017-02-28 DIAGNOSIS — K259 Gastric ulcer, unspecified as acute or chronic, without hemorrhage or perforation: Secondary | ICD-10-CM | POA: Diagnosis present

## 2017-02-28 DIAGNOSIS — K2970 Gastritis, unspecified, without bleeding: Secondary | ICD-10-CM | POA: Diagnosis not present

## 2017-02-28 DIAGNOSIS — Z79899 Other long term (current) drug therapy: Secondary | ICD-10-CM | POA: Diagnosis not present

## 2017-02-28 DIAGNOSIS — K253 Acute gastric ulcer without hemorrhage or perforation: Secondary | ICD-10-CM | POA: Diagnosis not present

## 2017-02-28 DIAGNOSIS — Z7982 Long term (current) use of aspirin: Secondary | ICD-10-CM | POA: Diagnosis not present

## 2017-02-28 DIAGNOSIS — I120 Hypertensive chronic kidney disease with stage 5 chronic kidney disease or end stage renal disease: Secondary | ICD-10-CM | POA: Diagnosis not present

## 2017-02-28 HISTORY — PX: ESOPHAGOGASTRODUODENOSCOPY (EGD) WITH PROPOFOL: SHX5813

## 2017-02-28 SURGERY — ESOPHAGOGASTRODUODENOSCOPY (EGD) WITH PROPOFOL
Anesthesia: General

## 2017-02-28 MED ORDER — LIDOCAINE HCL (CARDIAC) 20 MG/ML IV SOLN
INTRAVENOUS | Status: DC | PRN
  Administered 2017-02-28: 60 mg via INTRATRACHEAL

## 2017-02-28 MED ORDER — GLYCOPYRROLATE 0.2 MG/ML IJ SOLN
INTRAMUSCULAR | Status: DC | PRN
  Administered 2017-02-28: 0.2 mg via INTRAVENOUS

## 2017-02-28 MED ORDER — PROPOFOL 500 MG/50ML IV EMUL
INTRAVENOUS | Status: DC | PRN
  Administered 2017-02-28: 150 ug/kg/min via INTRAVENOUS

## 2017-02-28 MED ORDER — SODIUM CHLORIDE 0.9 % IV SOLN
INTRAVENOUS | Status: DC
  Administered 2017-02-28: 10:00:00 via INTRAVENOUS

## 2017-02-28 MED ORDER — LIDOCAINE HCL (PF) 2 % IJ SOLN
INTRAMUSCULAR | Status: AC
  Filled 2017-02-28: qty 2

## 2017-02-28 MED ORDER — PROPOFOL 10 MG/ML IV BOLUS
INTRAVENOUS | Status: DC | PRN
  Administered 2017-02-28: 50 mg via INTRAVENOUS

## 2017-02-28 MED ORDER — GLYCOPYRROLATE 0.2 MG/ML IJ SOLN
INTRAMUSCULAR | Status: AC
  Filled 2017-02-28: qty 1

## 2017-02-28 MED ORDER — FENTANYL CITRATE (PF) 100 MCG/2ML IJ SOLN
INTRAMUSCULAR | Status: DC | PRN
  Administered 2017-02-28: 50 ug via INTRAVENOUS

## 2017-02-28 MED ORDER — FENTANYL CITRATE (PF) 100 MCG/2ML IJ SOLN
INTRAMUSCULAR | Status: AC
  Filled 2017-02-28: qty 2

## 2017-02-28 NOTE — Telephone Encounter (Signed)
Unable to lvm for patient with results.   Pt's hemoglobin is normal, per Dr. Vicente Males.

## 2017-02-28 NOTE — Telephone Encounter (Signed)
Spoke with patient's husband.  Advised results per Dr. Vicente Males.

## 2017-02-28 NOTE — Anesthesia Procedure Notes (Signed)
Performed by: Shavawn Stobaugh Pre-anesthesia Checklist: Patient identified, Emergency Drugs available, Suction available, Patient being monitored and Timeout performed Patient Re-evaluated:Patient Re-evaluated prior to inductionOxygen Delivery Method: Nasal cannula Preoxygenation: Pre-oxygenation with 100% oxygen Intubation Type: IV induction       

## 2017-02-28 NOTE — Telephone Encounter (Signed)
-----   Message from Jonathon Bellows, MD sent at 02/26/2017  4:46 PM EDT ----- Hb normal now

## 2017-02-28 NOTE — Anesthesia Postprocedure Evaluation (Signed)
Anesthesia Post Note  Patient: Brianna Hamilton  Procedure(s) Performed: Procedure(s) (LRB): ESOPHAGOGASTRODUODENOSCOPY (EGD) WITH PROPOFOL (N/A)  Patient location during evaluation: PACU Anesthesia Type: General Level of consciousness: awake and alert Pain management: pain level controlled Vital Signs Assessment: post-procedure vital signs reviewed and stable Respiratory status: spontaneous breathing, nonlabored ventilation, respiratory function stable and patient connected to nasal cannula oxygen Cardiovascular status: blood pressure returned to baseline and stable Postop Assessment: no signs of nausea or vomiting Anesthetic complications: no     Last Vitals:  Vitals:   02/28/17 1125 02/28/17 1135  BP: (!) 168/97 (!) 172/94  Pulse: 84 81  Resp: 13 17  Temp:      Last Pain:  Vitals:   02/28/17 1105  TempSrc: Tympanic                 [name]

## 2017-02-28 NOTE — Anesthesia Preprocedure Evaluation (Signed)
Anesthesia Evaluation  Patient identified by MRN, date of birth, ID band Patient awake    Reviewed: Allergy & Precautions, H&P , NPO status , Patient's Chart, lab work & pertinent test results, reviewed documented beta blocker date and time   Airway Mallampati: II   Neck ROM: full    Dental  (+) Poor Dentition   Pulmonary neg pulmonary ROS,    Pulmonary exam normal        Cardiovascular hypertension, negative cardio ROS Normal cardiovascular exam Rhythm:regular Rate:Normal     Neuro/Psych PSYCHIATRIC DISORDERS negative neurological ROS  negative psych ROS   GI/Hepatic negative GI ROS, Neg liver ROS, PUD,   Endo/Other  negative endocrine ROS  Renal/GU ESRFRenal diseasenegative Renal ROS  negative genitourinary   Musculoskeletal negative musculoskeletal ROS (+)   Abdominal   Peds negative pediatric ROS (+)  Hematology negative hematology ROS (+) anemia ,   Anesthesia Other Findings Past Medical History: No date: Dialysis patient ([place]) No date: Hypertension No date: Renal disorder Past Surgical History: No date: ABDOMINAL HYSTERECTOMY 12/18/2016: ESOPHAGOGASTRODUODENOSCOPY (EGD) WITH PROPOFOL N/A     Comment: Procedure: ESOPHAGOGASTRODUODENOSCOPY (EGD)               WITH PROPOFOL;  Surgeon: Jonathon Bellows, MD;                Location: [facility] ENDOSCOPY;  Service: Endoscopy;               Laterality: N/A; BMI    Body Mass Index:  26.76 kg/m     Reproductive/Obstetrics negative OB ROS                             Anesthesia Physical Anesthesia Plan  ASA: III  Anesthesia Plan: General   Post-op Pain Management:    Induction:   Airway Management Planned:   Additional Equipment:   Intra-op Plan:   Post-operative Plan:   Informed Consent: I have reviewed the patients History and Physical, chart, labs and discussed the procedure including the risks, benefits and alternatives for the  proposed anesthesia with the patient or authorized representative who has indicated his/her understanding and acceptance.   Dental Advisory Given  Plan Discussed with: CRNA  Anesthesia Plan Comments:         Anesthesia Quick Evaluation

## 2017-02-28 NOTE — Anesthesia Post-op Follow-up Note (Cosign Needed)
Anesthesia QCDR form completed.        

## 2017-02-28 NOTE — H&P (Signed)
Jonathon Bellows MD [address], [place], [place] [address] Phone: [phone] Fax : [phone]  Primary Care Physician:  Marinda Elk, MD Primary Gastroenterologist:  Dr. Jonathon Bellows   Pre-Procedure History & Physical: HPI:  Brianna Hamilton is a 61 y.o. female is here for an endoscopy.   Past Medical History:  Diagnosis Date  . Dialysis patient ([place])   . Hypertension   . Renal disorder     Past Surgical History:  Procedure Laterality Date  . ABDOMINAL HYSTERECTOMY    . ESOPHAGOGASTRODUODENOSCOPY (EGD) WITH PROPOFOL N/A 12/18/2016   Procedure: ESOPHAGOGASTRODUODENOSCOPY (EGD) WITH PROPOFOL;  Surgeon: Jonathon Bellows, MD;  Location: [facility] ENDOSCOPY;  Service: Endoscopy;  Laterality: N/A;    Prior to Admission medications   Medication Sig Start Date End Date Taking? Authorizing Provider  acetaminophen (TYLENOL) 325 MG tablet Take 325 mg by mouth.    Historical Provider, MD  amLODipine (NORVASC) 5 MG tablet Take 5 mg by mouth daily. 12/12/16   Historical Provider, MD  aspirin EC 81 MG tablet Take by mouth.    Historical Provider, MD  ethyl chloride spray Apply topically. 08/20/16   Historical Provider, MD  furosemide (LASIX) 20 MG tablet Take 20 mg by mouth daily.    Historical Provider, MD  nystatin (NYSTATIN) powder Apply thin layer to affected area up to 2 times daily as needed. 04/15/16   Jami L Hagler, PA-C  pantoprazole (PROTONIX) 40 MG tablet Take 1 tablet (40 mg total) by mouth 2 (two) times daily before a meal. 12/19/16   Hillary Bow, MD  sevelamer carbonate (RENVELA) 800 MG tablet Take 1,600 mg by mouth 3 (three) times daily with meals. 06/27/16 06/27/17  Historical Provider, MD  sodium bicarbonate 650 MG tablet TAKE 1 TABLET BY MOUTH TWICE A DAY 07/30/15   Historical Provider, MD  valsartan (DIOVAN) 80 MG tablet Take 80 mg by mouth daily. 10/27/16   Historical Provider, MD  vitamin B-12 (CYANOCOBALAMIN) 1000 MCG tablet Take 1,000 mcg by mouth daily.    Historical  Provider, MD    Allergies as of 01/23/2017 - Review Complete 01/22/2017  Allergen Reaction Noted  . Sulfa antibiotics Other (See Comments) 11/30/2014    No family history on file.  Social History   Social History  . Marital status: Single    Spouse name: N/A  . Number of children: N/A  . Years of education: N/A   Occupational History  . Not on file.   Social History Main Topics  . Smoking status: Never Smoker  . Smokeless tobacco: Never Used  . Alcohol use No  . Drug use: Unknown  . Sexual activity: Not on file   Other Topics Concern  . Not on file   Social History Narrative  . No narrative on file    Review of Systems: See HPI, otherwise negative ROS  Physical Exam: There were no vitals taken for this visit. General:   Alert,  pleasant and cooperative in NAD Head:  Normocephalic and atraumatic. Neck:  Supple; no masses or thyromegaly. Lungs:  Clear throughout to auscultation.    Heart:  Regular rate and rhythm. Abdomen:  Soft, nontender and nondistended. Normal bowel sounds, without guarding, and without rebound.   Neurologic:  Alert and  oriented x4;  grossly normal neurologically.  Impression/Plan: Brianna Hamilton is here for an endoscopy to be performed for evaluation of gastric ulcer   Risks, benefits, limitations, and alternatives regarding  endoscopy have been reviewed with the patient.  Questions  have been answered.  All parties agreeable.   Jonathon Bellows, MD  02/28/2017, 9:59 AM

## 2017-02-28 NOTE — Transfer of Care (Signed)
Immediate Anesthesia Transfer of Care Note  Patient: Brianna Hamilton  Procedure(s) Performed: Procedure(s): ESOPHAGOGASTRODUODENOSCOPY (EGD) WITH PROPOFOL (N/A)  Patient Location: PACU  Anesthesia Type:General  Level of Consciousness: sedated and responds to stimulation  Airway & Oxygen Therapy: Patient Spontanous Breathing and Patient connected to nasal cannula oxygen  Post-op Assessment: Report given to RN and Post -op Vital signs reviewed and stable  Post vital signs: Reviewed and stable  Last Vitals:  Vitals:   02/28/17 1005 02/28/17 1106  BP: (!) 179/93 121/75  Pulse: 72 87  Resp: 16 16  Temp: 36.6 C     Last Pain:  Vitals:   02/28/17 1005  TempSrc: Tympanic         Complications: No apparent anesthesia complications

## 2017-02-28 NOTE — Op Note (Signed)
[facility] Patient Name: Brianna Hamilton Procedure Date: 02/28/2017 10:45 AM MRN: [id number] Account #: [id number] Date of Birth: [birth year] Admit Type: Outpatient Age: 61 Room: [name] ENDO ROOM 4 Gender: Female Note Status: Finalized Procedure:            Upper GI endoscopy Indications:          Follow-up of gastric ulcer Providers:            Jonathon Bellows MD, MD Referring MD:         Precious Bard, MD (Referring MD) Medicines:            Monitored Anesthesia Care Complications:        No immediate complications. Procedure:            Pre-Anesthesia Assessment:                       - Prior to the procedure, a History and Physical was                        performed, and patient medications, allergies and                        sensitivities were reviewed. The patient's tolerance of                        previous anesthesia was reviewed.                       - The risks and benefits of the procedure and the                        sedation options and risks were discussed with the                        patient. All questions were answered and informed                        consent was obtained.                       - ASA Grade Assessment: III - A patient with severe                        systemic disease.                       After obtaining informed consent, the endoscope was                        passed under direct vision. Throughout the procedure,                        the patient's blood pressure, pulse, and oxygen                        saturations were monitored continuously. The Endoscope                        was introduced through the mouth, and advanced to the  third part of duodenum. The upper GI endoscopy was                        accomplished with ease. The patient tolerated the                        procedure well. Findings:      The examined duodenum was normal.      The esophagus was  normal.      The examined duodenum was normal.      Diffuse moderate inflammation characterized by congestion (edema) and       erythema was found in the gastric antrum and in the prepyloric region of       the stomach. Biopsies were taken with a cold forceps for histology.       Previously seen ulcers have healed Impression:           - Normal examined duodenum.                       - Normal esophagus.                       - Normal examined duodenum.                       - Gastritis. Biopsied. Recommendation:       - Discharge patient to home (with escort).                       - Resume previous diet.                       - Continue present medications.                       - Await pathology results.                       - Return to my office in 4 weeks. Procedure Code(s):    --- Professional ---                       [id number], Esophagogastroduodenoscopy, flexible, transoral;                        with biopsy, single or multiple Diagnosis Code(s):    --- Professional ---                       K29.70, Gastritis, unspecified, without bleeding                       K25.9, Gastric ulcer, unspecified as acute or chronic,                        without hemorrhage or perforation CPT copyright 2016 American Medical Association. All rights reserved. The codes documented in this report are preliminary and upon coder review may  be revised to meet current compliance requirements. Jonathon Bellows, MD Jonathon Bellows MD, MD 02/28/2017 11:04:47 AM This report has been signed electronically. Number of Addenda: 0 Note Initiated On: 02/28/2017 10:45 AM      [facility]

## 2017-03-01 ENCOUNTER — Encounter: Payer: Self-pay | Admitting: Gastroenterology

## 2017-03-04 ENCOUNTER — Other Ambulatory Visit: Payer: Self-pay

## 2017-03-04 ENCOUNTER — Telehealth: Payer: Self-pay

## 2017-03-04 NOTE — Telephone Encounter (Signed)
Called pt to advise Pylera sample available for pick-up in the office.   Unable to leave voicemail no VM on phone.

## 2017-03-05 LAB — SURGICAL PATHOLOGY

## 2017-03-08 ENCOUNTER — Other Ambulatory Visit: Payer: Self-pay

## 2017-03-08 ENCOUNTER — Telehealth: Payer: Self-pay | Admitting: Gastroenterology

## 2017-03-08 NOTE — Telephone Encounter (Signed)
Brianna Hamilton wanting to know if [name] needs a f/u appt after she finishes taking her medications?

## 2017-03-11 ENCOUNTER — Emergency Department
Admission: EM | Admit: 2017-03-11 | Discharge: 2017-03-11 | Disposition: A | Discharge status: Home / Self Care | Payer: Medicare Other | Attending: Emergency Medicine | Admitting: Emergency Medicine

## 2017-03-11 ENCOUNTER — Encounter: Payer: Self-pay | Admitting: Emergency Medicine

## 2017-03-11 DIAGNOSIS — R197 Diarrhea, unspecified: Secondary | ICD-10-CM | POA: Diagnosis not present

## 2017-03-11 DIAGNOSIS — Z7982 Long term (current) use of aspirin: Secondary | ICD-10-CM | POA: Insufficient documentation

## 2017-03-11 DIAGNOSIS — Z992 Dependence on renal dialysis: Secondary | ICD-10-CM | POA: Diagnosis not present

## 2017-03-11 DIAGNOSIS — L089 Local infection of the skin and subcutaneous tissue, unspecified: Secondary | ICD-10-CM

## 2017-03-11 DIAGNOSIS — Z79899 Other long term (current) drug therapy: Secondary | ICD-10-CM | POA: Diagnosis not present

## 2017-03-11 DIAGNOSIS — I129 Hypertensive chronic kidney disease with stage 1 through stage 4 chronic kidney disease, or unspecified chronic kidney disease: Secondary | ICD-10-CM | POA: Insufficient documentation

## 2017-03-11 DIAGNOSIS — N189 Chronic kidney disease, unspecified: Secondary | ICD-10-CM | POA: Diagnosis not present

## 2017-03-11 DIAGNOSIS — K6289 Other specified diseases of anus and rectum: Secondary | ICD-10-CM

## 2017-03-11 DIAGNOSIS — L989 Disorder of the skin and subcutaneous tissue, unspecified: Secondary | ICD-10-CM | POA: Diagnosis not present

## 2017-03-11 LAB — CBC WITH DIFFERENTIAL/PLATELET
Basophils Absolute: 0.1 10*3/uL (ref 0–0.1)
Basophils Relative: 1 %
EOS PCT: 4 %
Eosinophils Absolute: 0.3 10*3/uL (ref 0–0.7)
HCT: 42.7 % (ref 35.0–47.0)
HEMOGLOBIN: 14.1 g/dL (ref 12.0–16.0)
LYMPHS ABS: 1.5 10*3/uL (ref 1.0–3.6)
LYMPHS PCT: 23 %
MCH: 31.1 pg (ref 26.0–34.0)
MCHC: 33 g/dL (ref 32.0–36.0)
MCV: 94.4 fL (ref 80.0–100.0)
MONOS PCT: 5 %
Monocytes Absolute: 0.3 10*3/uL (ref 0.2–0.9)
Neutro Abs: 4.5 10*3/uL (ref 1.4–6.5)
Neutrophils Relative %: 67 %
Platelets: 179 10*3/uL (ref 150–440)
RBC: 4.52 MIL/uL (ref 3.80–5.20)
RDW: 15.7 % — ABNORMAL HIGH (ref 11.5–14.5)
WBC: 6.7 10*3/uL (ref 3.6–11.0)

## 2017-03-11 LAB — BASIC METABOLIC PANEL
Anion gap: 8 (ref 5–15)
BUN: 49 mg/dL — AB (ref 6–20)
CHLORIDE: 105 mmol/L (ref 101–111)
CO2: 25 mmol/L (ref 22–32)
CREATININE: 4.17 mg/dL — AB (ref 0.44–1.00)
Calcium: 10 mg/dL (ref 8.9–10.3)
GFR calc Af Amer: 12 mL/min — ABNORMAL LOW (ref >60)
GFR calc non Af Amer: 11 mL/min — ABNORMAL LOW (ref >60)
GLUCOSE: 83 mg/dL (ref 65–99)
POTASSIUM: 5.2 mmol/L — AB (ref 3.5–5.1)
Sodium: 138 mmol/L (ref 135–145)

## 2017-03-11 MED ORDER — MUPIROCIN CALCIUM 2 % EX CREA: TOPICAL_CREAM | CUTANEOUS | 0 refills | Status: DC

## 2017-03-11 NOTE — ED Notes (Signed)
See triage note  Developed a possible abscess area to groin 2 days ago  Afebrile on arrival

## 2017-03-11 NOTE — ED Provider Notes (Signed)
[facility] Emergency Department Provider Note   ____________________________________________   First MD Initiated Contact with Patient 03/11/17 1755     (approximate)  I have reviewed the triage vital signs and the nursing notes.   HISTORY  Chief Complaint Abscess Patient and boyfriend   HPI Brianna Hamilton is a 61 y.o. female is here with complaint of pain in her rectal area. Patient states this began 2 days ago. She was seen by a GI doctor and had a endoscopy done for her abdominal pain. Patient picked up samples at the doctor's office and has been taking them. Patient also is a dialysis patient. She states that she does take her blood pressure medication as she was directed and did so today. Patient states that she has burning when she has a bowel movement. In looking through her records it appears that she has a history of GI bleeds. She also recently was placed on Pylera and apparently is having more frequent bowel movements than usual. Boyfriend states he brought patient here on his Moped and patient voices that this also hurt to ride.     Past Medical History:  Diagnosis Date  . Dialysis patient ([place])   . Hypertension   . Renal disorder     Patient Active Problem List   Diagnosis Date Noted  . Delayed gastric emptying 01/23/2017  . Hypertension 01/23/2017  . Solitary kidney, congenital 01/23/2017  . 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 Duodenal ulcer 12/19/2016  . Acute blood loss anemia 12/19/2016  . GIB (gastrointestinal bleeding) 12/18/2016  . Moderate mitral insufficiency 03/08/2015  . Chest pain 02/28/2015  . Moderate tricuspid insufficiency 02/22/2015  . 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  . Primary chronic pseudo-obstruction of stomach 11/03/2014  . Anemia 05/31/2014  . Hyperparathyroidism , secondary, non-renal ([place]) 05/31/2014  . 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 Hearing loss 08/15/2011   . Chronic kidney disease 08/14/2011  . Mental retardation 08/14/2011  . Low serum vitamin D 05/11/2008  . Diverticulosis 03/01/2008    Past Surgical History:  Procedure Laterality Date  . ABDOMINAL HYSTERECTOMY    . ESOPHAGOGASTRODUODENOSCOPY (EGD) WITH PROPOFOL N/A 12/18/2016   Procedure: ESOPHAGOGASTRODUODENOSCOPY (EGD) WITH PROPOFOL;  Surgeon: Jonathon Bellows, MD;  Location: [facility] ENDOSCOPY;  Service: Endoscopy;  Laterality: N/A;  . ESOPHAGOGASTRODUODENOSCOPY (EGD) WITH PROPOFOL N/A 02/28/2017   Procedure: ESOPHAGOGASTRODUODENOSCOPY (EGD) WITH PROPOFOL;  Surgeon: Jonathon Bellows, MD;  Location: [facility] ENDOSCOPY;  Service: Endoscopy;  Laterality: N/A;    Prior to Admission medications   Medication Sig Start Date End Date Taking? Authorizing Provider  acetaminophen (TYLENOL) 325 MG tablet Take 325 mg by mouth.    Historical Provider, MD  amLODipine (NORVASC) 5 MG tablet Take 5 mg by mouth daily. 12/12/16   Historical Provider, MD  aspirin EC 81 MG tablet Take by mouth.    Historical Provider, MD  ethyl chloride spray Apply topically. 08/20/16   Historical Provider, MD  furosemide (LASIX) 20 MG tablet Take 20 mg by mouth daily.    Historical Provider, MD  mupirocin cream (BACTROBAN) 2 % Apply to affected area 3 times daily 03/11/17   Charline Bills Cuthriell, PA-C  nystatin (NYSTATIN) powder Apply thin layer to affected area up to 2 times daily as needed. 04/15/16   Jami L Hagler, PA-C  pantoprazole (PROTONIX) 40 MG tablet Take 1 tablet (40 mg total) by mouth 2 (two) times daily before a meal. 12/19/16   Hillary Bow, MD  sevelamer carbonate (RENVELA) 800 MG  tablet Take 1,600 mg by mouth 3 (three) times daily with meals. 06/27/16 06/27/17  Historical Provider, MD  sodium bicarbonate 650 MG tablet TAKE 1 TABLET BY MOUTH TWICE A DAY 07/30/15   Historical Provider, MD  valsartan (DIOVAN) 160 MG tablet Take 160 mg by mouth daily. 02/04/17   Historical Provider, MD  valsartan (DIOVAN) 80 MG tablet Take 80 mg by mouth  daily. 01/07/17   Historical Provider, MD  vitamin B-12 (CYANOCOBALAMIN) 1000 MCG tablet Take 1,000 mcg by mouth daily.    Historical Provider, MD    Allergies Sulfa antibiotics  No family history on file.  Social History Social History  Substance Use Topics  . Smoking status: Never Smoker  . Smokeless tobacco: Never Used  . Alcohol use No    Review of Systems Constitutional: No fever/chills Cardiovascular: Denies chest pain. Respiratory: Denies shortness of breath. Gastrointestinal:   No nausea, no vomiting.  No diarrhea.  No constipation.  History of GI bleed. Genitourinary: Negative for dysuria. Musculoskeletal: Negative for back pain. Skin: Questionable abscess. Neurological: Negative for headaches, focal weakness or numbness. Psychiatric:Mental retardation. Hematological/Lymphatic:Anemia secondary to chronic kidney disease. Allergic/Immunilogical: Sulfa 10-point ROS otherwise negative.  ____________________________________________   PHYSICAL EXAM:  VITAL SIGNS: ED Triage Vitals  Enc Vitals Group     BP 03/11/17 1654 (!) 197/95     Pulse Rate 03/11/17 1654 88     Resp 03/11/17 1654 18     Temp 03/11/17 1654 98.5 F (36.9 C)     Temp Source 03/11/17 1654 Oral     SpO2 03/11/17 1654 99 %     Weight 03/11/17 1656 140 lb (63.5 kg)     Height 03/11/17 1656 5' (1.524 m)     Head Circumference --      Peak Flow --      Pain Score 03/11/17 1654 3     Pain Loc --      Pain Edu? --      Excl. in [place]? --     Constitutional: Alert and oriented. Well appearing and in no acute distress. Patient has a speech impediment is very difficult to understand however boyfriend is also in the room is helping with her history. Patient does not appear to be any acute distress. Eyes: Conjunctivae are normal. PERRL. EOMI. Head: Atraumatic. Nose: No congestion/rhinnorhea. Neck: No stridor.   Cardiovascular: Normal rate, regular rhythm. Grossly normal heart sounds.  Good peripheral  circulation. Respiratory: Normal respiratory effort.  No retractions. Lungs CTAB. Gastrointestinal: Soft and nontender. No distention. Bowel sounds in 4 quadrants is within normal limits. Rectal exam revealed what appears to be superficial pustule lateral to the anus on the left cheek.  No soft tissue edema.  No cystic formation is appreciated on palpation.  Patient does not appear to be in pain on palpation.  No drainage or blood noted.  Hygiene is poor.   Musculoskeletal: No lower extremity tenderness nor edema.   Neurologic:   No gross focal neurologic deficits are appreciated. No gait instability. Skin:  Skin is warm, dry and intact. Psychiatric: Mood and affect are normal. Speech and behavior are normal.  ____________________________________________   LABS (all labs ordered are listed, but only abnormal results are displayed)  Labs Reviewed  CBC WITH DIFFERENTIAL/PLATELET - Abnormal; Notable for the following:       Result Value   RDW 15.7 (*)    All other components within normal limits  BASIC METABOLIC PANEL - Abnormal; Notable for the following:  Potassium 5.2 (*)    BUN 49 (*)    Creatinine, Ser 4.17 (*)    GFR calc non Af Amer 11 (*)    GFR calc Af Amer 12 (*)    All other components within normal limits   ____________________________________________  EKG  Deferred ____________________________________________  RADIOLOGY  Deferred ____________________________________________   PROCEDURES  Procedure(s) performed: None  Procedures  Critical Care performed: No  ____________________________________________   INITIAL IMPRESSION / ASSESSMENT AND PLAN / ED COURSE  Pertinent labs & imaging results that were available during my care of the patient were reviewed by me and considered in my medical decision making (see chart for details).  ----------------------------------------- 7:11 PM on 03/11/2017 ----------------------------------------- At this time  labs are pending. Patient was evaluated by this provider. Patient was turned over to Betha Loa, PA-C for final disposition and treatment.  Also sister for this patient this coming to the department after 7:00 PM when she gets off work.     ____________________________________________   FINAL CLINICAL IMPRESSION(S) / ED DIAGNOSES  Final diagnoses:  Pustular lesion  Diarrhea, unspecified type  Rectal pain      NEW MEDICATIONS STARTED DURING THIS VISIT:  Discharge Medication List as of 03/11/2017  8:35 PM    START taking these medications   Details  mupirocin cream (BACTROBAN) 2 % Apply to affected area 3 times daily, Print         Note:  This document was prepared using Dragon voice recognition software and may include unintentional dictation errors.    Johnn Hai, PA-C 03/12/17 [place], MD 03/12/17 [phone]

## 2017-03-11 NOTE — ED Provider Notes (Signed)
-----------------------------------------   8:25 PM on 03/11/2017 -----------------------------------------   Blood pressure (!) 168/78, pulse 80, temperature 98.5 F (36.9 C), temperature source Oral, resp. rate 16, height 5' (1.524 m), weight 63.5 kg, SpO2 98 %.  Assuming care from [name], PA-C.  In short, Brianna Hamilton is a 61 y.o. female with a chief complaint of Abscess .  Refer to the original H&P for additional details.  Patient care was turned over to myself pending results of labs. Patient reported increased rectal pain/burning sensation with increased diarrhea. Patient was concerned of a "abscess" to the buttocks. Patient was evaluated by the previous provider with no indication of cellulitis, abscess. Due to patient's complaints, basic labs were ordered. This returns with high potassium, decreased kidney function. Patient is a dialysis patient and findings are consistent with same. At this time, no indication of systemic infection worrisome for perirectal abscess. Exam was not concerning for perirectal abscess. No indication of hemorrhoids with rectal exam. At this time, patient does have a single, superficial, pustule to the right buttocks with no surrounding erythema or edema consistent with cellulitis or abscess. This will be treated with topical mupirocin cream. The patient continues to have GI complaints, she will follow-up with her gastroenterologist.     Final diagnosis: Pustular lesion Diarrhea      Darletta Moll, PA-C 03/11/17 2211    Harvest Dark, MD 03/11/17 [phone]

## 2017-03-11 NOTE — ED Triage Notes (Signed)
Pt in via POV with complaints of a "boil" to right groin x 2 days.  Pt reports pain, swelling, states she put neosporin on area but without relief.  Pt afebrile, NAD noted at this time.

## 2017-03-28 ENCOUNTER — Other Ambulatory Visit: Payer: Self-pay

## 2017-03-28 ENCOUNTER — Telehealth: Payer: Self-pay

## 2017-03-28 DIAGNOSIS — A048 Other specified bacterial intestinal infections: Secondary | ICD-10-CM

## 2017-03-28 NOTE — Telephone Encounter (Signed)
-----   Message from Jonathon Bellows, MD sent at 03/28/2017  2:49 PM EDT ----- H pylori positive- needs omeprazole 40mg  once daily , amoxicillin 1 gram BID and clarithromycin 500 mg BID for 14 days, recheck H pylori stool antigen in 6 weeks . Check for penicillin allergergy

## 2017-03-28 NOTE — Telephone Encounter (Signed)
Patient scheduled for repeat H. Pylori labs now that Pylera medication is completed, per Dr. Vicente Males.

## 2017-04-02 ENCOUNTER — Other Ambulatory Visit
Admission: RE | Admit: 2017-04-02 | Discharge: 2017-04-02 | Disposition: A | Discharge status: Home / Self Care | Payer: Medicare Other | Source: Ambulatory Visit | Attending: Gastroenterology | Admitting: Gastroenterology

## 2017-04-02 DIAGNOSIS — A048 Other specified bacterial intestinal infections: Secondary | ICD-10-CM | POA: Diagnosis present

## 2017-04-03 LAB — H. PYLORI ANTIGEN, STOOL: H. PYLORI STOOL AG, EIA: NEGATIVE

## 2017-04-08 ENCOUNTER — Telehealth: Payer: Self-pay

## 2017-04-08 NOTE — Telephone Encounter (Signed)
-----   Message from Jonathon Bellows, MD sent at 04/07/2017  9:34 PM EDT ----- H pylori negative

## 2017-04-08 NOTE — Telephone Encounter (Signed)
Advised Brianna Hamilton that patient is H. Pylori negative per Dr. Vicente Males.

## 2017-08-15 ENCOUNTER — Other Ambulatory Visit: Payer: Self-pay | Admitting: Physician Assistant

## 2017-08-15 DIAGNOSIS — Z1231 Encounter for screening mammogram for malignant neoplasm of breast: Secondary | ICD-10-CM

## 2017-08-28 ENCOUNTER — Encounter: Payer: Self-pay | Admitting: Emergency Medicine

## 2017-08-28 ENCOUNTER — Emergency Department: Payer: Medicare Other

## 2017-08-28 ENCOUNTER — Emergency Department
Admission: EM | Admit: 2017-08-28 | Discharge: 2017-08-28 | Disposition: A | Discharge status: Home / Self Care | Payer: Medicare Other | Attending: Emergency Medicine | Admitting: Emergency Medicine

## 2017-08-28 DIAGNOSIS — Z992 Dependence on renal dialysis: Secondary | ICD-10-CM | POA: Diagnosis not present

## 2017-08-28 DIAGNOSIS — G8929 Other chronic pain: Secondary | ICD-10-CM | POA: Diagnosis not present

## 2017-08-28 DIAGNOSIS — M546 Pain in thoracic spine: Secondary | ICD-10-CM | POA: Insufficient documentation

## 2017-08-28 DIAGNOSIS — Z79899 Other long term (current) drug therapy: Secondary | ICD-10-CM | POA: Diagnosis not present

## 2017-08-28 DIAGNOSIS — I129 Hypertensive chronic kidney disease with stage 1 through stage 4 chronic kidney disease, or unspecified chronic kidney disease: Secondary | ICD-10-CM | POA: Diagnosis not present

## 2017-08-28 DIAGNOSIS — Z7982 Long term (current) use of aspirin: Secondary | ICD-10-CM | POA: Diagnosis not present

## 2017-08-28 DIAGNOSIS — N189 Chronic kidney disease, unspecified: Secondary | ICD-10-CM | POA: Diagnosis not present

## 2017-08-28 LAB — URINALYSIS, COMPLETE (UACMP) WITH MICROSCOPIC
BACTERIA UA: NONE SEEN
Bilirubin Urine: NEGATIVE
GLUCOSE, UA: 100 mg/dL — AB
HGB URINE DIPSTICK: NEGATIVE
KETONES UR: NEGATIVE mg/dL
LEUKOCYTES UA: NEGATIVE
Nitrite: NEGATIVE
PH: 8 (ref 5.0–8.0)
Protein, ur: 100 mg/dL — AB
RBC / HPF: NONE SEEN RBC/hpf (ref 0–5)
Specific Gravity, Urine: 1.015 (ref 1.005–1.030)
WBC, UA: NONE SEEN WBC/hpf (ref 0–5)

## 2017-08-28 LAB — COMPREHENSIVE METABOLIC PANEL
ALBUMIN: 4.7 g/dL (ref 3.5–5.0)
ALK PHOS: 71 U/L (ref 38–126)
ALT: 26 U/L (ref 14–54)
ANION GAP: 12 (ref 5–15)
AST: 41 U/L (ref 15–41)
BUN: 22 mg/dL — ABNORMAL HIGH (ref 6–20)
CALCIUM: 9 mg/dL (ref 8.9–10.3)
CO2: 27 mmol/L (ref 22–32)
Chloride: 96 mmol/L — ABNORMAL LOW (ref 101–111)
Creatinine, Ser: 2.54 mg/dL — ABNORMAL HIGH (ref 0.44–1.00)
GFR calc Af Amer: 22 mL/min — ABNORMAL LOW (ref >60)
GFR calc non Af Amer: 19 mL/min — ABNORMAL LOW (ref >60)
GLUCOSE: 104 mg/dL — AB (ref 65–99)
POTASSIUM: 4.2 mmol/L (ref 3.5–5.1)
SODIUM: 135 mmol/L (ref 135–145)
Total Bilirubin: 1.4 mg/dL — ABNORMAL HIGH (ref 0.3–1.2)
Total Protein: 7.9 g/dL (ref 6.5–8.1)

## 2017-08-28 LAB — CBC
HEMATOCRIT: 37.8 % (ref 35.0–47.0)
HEMOGLOBIN: 13.1 g/dL (ref 12.0–16.0)
MCH: 32.5 pg (ref 26.0–34.0)
MCHC: 34.7 g/dL (ref 32.0–36.0)
MCV: 93.8 fL (ref 80.0–100.0)
Platelets: 171 10*3/uL (ref 150–440)
RBC: 4.03 MIL/uL (ref 3.80–5.20)
RDW: 14.8 % — ABNORMAL HIGH (ref 11.5–14.5)
WBC: 5.1 10*3/uL (ref 3.6–11.0)

## 2017-08-28 LAB — LIPASE, BLOOD: Lipase: 58 U/L — ABNORMAL HIGH (ref 11–51)

## 2017-08-28 LAB — TROPONIN I

## 2017-08-28 NOTE — ED Triage Notes (Signed)
Pt arrived via EMS from dialysis for reports of abdominal pain, back pain and leg pain. EMS reports pt completed dialysis treatment prior to calling EMS. EMS reports VSS, NSR on monitor.Pt reports she usually feels bad after dialysis.

## 2017-08-28 NOTE — ED Provider Notes (Signed)
[facility] Emergency Department Provider Note  ____________________________________________  Time seen: Approximately 3:52 PM  I have reviewed the triage vital signs and the nursing notes.   HISTORY  Chief Complaint Abdominal Pain and Leg Pain    HPI KARILYN WIND is a 61 y.o. female who complains of upper back pain for the past several weeks, slightly worsened today after receiving dialysis. She indicates the area of about T3 or T4, right of midline. Nonradiating. No aggravating or alleviating factors. Described as aching and moderate intensity. No shortness of breath. No exertional or pleuritic symptoms. Hurts to move around.  She also complains of cramping in her right thigh. This is typical after dialysis.     Past Medical History:  Diagnosis Date  . Dialysis patient ([place])   . Hypertension   . Renal disorder      Patient Active Problem List   Diagnosis Date Noted  . Delayed gastric emptying 01/23/2017  . Hypertension 01/23/2017  . Solitary kidney, congenital 01/23/2017  . 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 Duodenal ulcer 12/19/2016  . Acute blood loss anemia 12/19/2016  . GIB (gastrointestinal bleeding) 12/18/2016  . Moderate mitral insufficiency 03/08/2015  . Chest pain 02/28/2015  . Moderate tricuspid insufficiency 02/22/2015  . 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  . Primary chronic pseudo-obstruction of stomach 11/03/2014  . Anemia 05/31/2014  . Hyperparathyroidism , secondary, non-renal ([place]) 05/31/2014  . Secondary non-renal hyperparathyroidism ([place]) 05/31/2014  . Hearing loss 08/15/2011  . Chronic kidney disease 08/14/2011  . Mental retardation 08/14/2011  . Low serum vitamin D 05/11/2008  . Diverticulosis 03/01/2008     Past Surgical History:  Procedure Laterality Date  . ABDOMINAL HYSTERECTOMY    . ESOPHAGOGASTRODUODENOSCOPY (EGD) WITH PROPOFOL N/A 12/18/2016   Procedure: ESOPHAGOGASTRODUODENOSCOPY  (EGD) WITH PROPOFOL;  Surgeon: Jonathon Bellows, MD;  Location: [facility] ENDOSCOPY;  Service: Endoscopy;  Laterality: N/A;  . ESOPHAGOGASTRODUODENOSCOPY (EGD) WITH PROPOFOL N/A 02/28/2017   Procedure: ESOPHAGOGASTRODUODENOSCOPY (EGD) WITH PROPOFOL;  Surgeon: Jonathon Bellows, MD;  Location: [facility] ENDOSCOPY;  Service: Endoscopy;  Laterality: N/A;     Prior to Admission medications   Medication Sig Start Date End Date Taking? Authorizing Provider  acetaminophen (TYLENOL) 325 MG tablet Take 325 mg by mouth.    [provider]  amLODipine (NORVASC) 5 MG tablet Take 5 mg by mouth daily. 12/12/16   [provider]  aspirin EC 81 MG tablet Take by mouth.    [provider]  ethyl chloride spray Apply topically. 08/20/16   [provider]  furosemide (LASIX) 20 MG tablet Take 20 mg by mouth daily.    [provider]  mupirocin cream (BACTROBAN) 2 % Apply to affected area 3 times daily 03/11/17   Cuthriell, Roderic Palau D, PA-C  nystatin (NYSTATIN) powder Apply thin layer to affected area up to 2 times daily as needed. 04/15/16   Hagler, Jami L, PA-C  pantoprazole (PROTONIX) 40 MG tablet Take 1 tablet (40 mg total) by mouth 2 (two) times daily before a meal. 12/19/16   Sudini, Srikar, MD  sodium bicarbonate 650 MG tablet TAKE 1 TABLET BY MOUTH TWICE A DAY 07/30/15   [provider]  valsartan (DIOVAN) 160 MG tablet Take 160 mg by mouth daily. 02/04/17   [provider]  valsartan (DIOVAN) 80 MG tablet Take 80 mg by mouth daily. 01/07/17   [provider]  vitamin B-12 (CYANOCOBALAMIN) 1000 MCG tablet Take 1,000 mcg by mouth daily.    [provider]  Allergies Sulfa antibiotics   No family history on file.  Social History Social History  Substance Use Topics  . Smoking status: Never Smoker  . Smokeless tobacco: Never Used  . Alcohol use No    Review of Systems  Constitutional:   No fever or chills.  ENT:   No sore throat. No  rhinorrhea. Cardiovascular:   No chest pain or syncope. Respiratory:   No dyspnea or cough. Gastrointestinal:   Negative for abdominal pain, vomiting and diarrhea.  Musculoskeletal:   thoracic back pain as above. Leg cramp as above. All other systems reviewed and are negative except as documented above in ROS and HPI.  ____________________________________________   PHYSICAL EXAM:  VITAL SIGNS: ED Triage Vitals  Enc Vitals Group     BP 08/28/17 1318 (!) 157/80     Pulse Rate 08/28/17 1318 63     Resp 08/28/17 1318 18     Temp 08/28/17 1318 97.7 F (36.5 C)     Temp Source 08/28/17 1318 Oral     SpO2 08/28/17 1318 100 %     Weight 08/28/17 1319 132 lb (59.9 kg)     Height 08/28/17 1319 5' (1.524 m)     Head Circumference --      Peak Flow --      Pain Score --      Pain Loc --      Pain Edu? --      Excl. in [place]? --     Vital signs reviewed, nursing assessments reviewed.   Constitutional:   Alert and oriented. Well appearing and in no distress. Eyes:   No scleral icterus.  EOMI. No nystagmus. No conjunctival pallor. PERRL. ENT   Head:   Normocephalic and atraumatic.   Nose:   No congestion/rhinnorhea.    Mouth/Throat:   MMM, no pharyngeal erythema. No peritonsillar mass.    Neck:   No meningismus. Full ROM Hematological/Lymphatic/Immunilogical:   No cervical lymphadenopathy. Cardiovascular:   RRR. Symmetric bilateral radial and DP pulses.  No murmurs.  Respiratory:   Normal respiratory effort without tachypnea/retractions. Breath sounds are clear and equal bilaterally. No wheezes/rales/rhonchi. Gastrointestinal:   Soft and nontender. Non distended. There is no CVA tenderness.  No rebound, rigidity, or guarding. Genitourinary:   deferred Musculoskeletal:   Normal range of motion in all extremities. No joint effusions.  spasm in the distal quadriceps, tenderness to touch in this area, reproduces the leg pain. Thoracic back pain is reproduced on palpation of the  rhomboids at the right. Left inferolateral chest wall also tender to the touch over the ribs without point tenderness. No ecchymosis or swelling. Chest wall stable Neurologic:   Normal speech and language.  Motor grossly intact. No gross focal neurologic deficits are appreciated.  Skin:    Skin is warm, dry and intact. No rash noted.  No petechiae, purpura, or bullae.  ____________________________________________    LABS (pertinent positives/negatives) (all labs ordered are listed, but only abnormal results are displayed) Labs Reviewed  LIPASE, BLOOD - Abnormal; Notable for the following:       Result Value   Lipase 58 (*)    All other components within normal limits  COMPREHENSIVE METABOLIC PANEL - Abnormal; Notable for the following:    Chloride 96 (*)    Glucose, Bld 104 (*)    BUN 22 (*)    Creatinine, Ser 2.54 (*)    Total Bilirubin 1.4 (*)    GFR calc non Af Amer 19 (*)  GFR calc Af Amer 22 (*)    All other components within normal limits  CBC - Abnormal; Notable for the following:    RDW 14.8 (*)    All other components within normal limits  URINALYSIS, COMPLETE (UACMP) WITH MICROSCOPIC - Abnormal; Notable for the following:    Glucose, UA 100 (*)    Protein, ur 100 (*)    Squamous Epithelial / LPF 0-5 (*)    All other components within normal limits  TROPONIN I   ____________________________________________   EKG  interpreted by me Normal sinus rhythm rate of 62, left axis, normal intervals. Poor R progression in anterior precordial leads. Normal ST segments. T wave inversion in V2 and V3, nonischemic in appearance.not significantly changed from Dec 18 2016.  ____________________________________________    RADIOLOGY  Dg Chest 2 View  Result Date: 08/28/2017 CLINICAL DATA:  Pt arrived via EMS from dialysis for reports of abdominal pain, back pain and leg pain. EMS reports pt completed dialysis treatment prior to calling EMS. EMS reports VSS, NSR on  monitor.Pt reports she usually feels bad after dialysis. EXAM: CHEST  2 VIEW COMPARISON:  12/18/2016 FINDINGS: The cardiac silhouette is mildly enlarged. No mediastinal or hilar masses. No evidence of adenopathy. Clear lungs.  No pleural effusion or pneumothorax. Stable right subclavian and axillary vascular stent. Skeletal structures are demineralized intact. IMPRESSION: No acute cardiopulmonary disease. Electronically Signed   By: Lajean Manes M.D.   On: 08/28/2017 15:14    ____________________________________________   PROCEDURES Procedures  ____________________________________________   INITIAL IMPRESSION / ASSESSMENT AND PLAN / ED COURSE  Pertinent labs & imaging results that were available during my care of the patient were reviewed by me and considered in my medical decision making (see chart for details).  patient well appearing no acute distress, presents with acute on chronic thoracic back pain after dialysis which is typical for her. Also has leg cramps, also pretty typical. No acute complaints. Screen the patient with labs and chest x-ray, all unremarkable. She does have chest wall tenderness without any ecchymosis or history of trauma. We'll discharge her home to follow up with primary care.Considering the patient's symptoms, medical history, and physical examination today, I have low suspicion for ACS, PE, TAD, pneumothorax, carditis, mediastinitis, pneumonia, CHF, or sepsis.        ____________________________________________   FINAL CLINICAL IMPRESSION(S) / ED DIAGNOSES  Final diagnoses:  Chronic thoracic back pain, unspecified back pain laterality      New Prescriptions   No medications on file     Portions of this note were generated with dragon dictation software. Dictation errors may occur despite best attempts at proofreading.    Carrie Mew, MD 08/28/17 1556

## 2017-09-03 ENCOUNTER — Ambulatory Visit
Admission: RE | Admit: 2017-09-03 | Discharge: 2017-09-03 | Disposition: A | Discharge status: Home / Self Care | Payer: Medicare Other | Source: Ambulatory Visit | Attending: Physician Assistant | Admitting: Physician Assistant

## 2017-09-03 DIAGNOSIS — Z1231 Encounter for screening mammogram for malignant neoplasm of breast: Secondary | ICD-10-CM | POA: Diagnosis present

## 2017-09-17 ENCOUNTER — Encounter: Payer: Self-pay | Admitting: Gastroenterology

## 2017-09-17 ENCOUNTER — Ambulatory Visit (INDEPENDENT_AMBULATORY_CARE_PROVIDER_SITE_OTHER): Payer: Medicare Other | Admitting: Gastroenterology

## 2017-09-17 ENCOUNTER — Other Ambulatory Visit: Payer: Self-pay

## 2017-09-17 VITALS — BP 153/82 | HR 71 | Temp 98.3°F | Ht 60.0 in | Wt 133.6 lb

## 2017-09-17 DIAGNOSIS — R1012 Left upper quadrant pain: Secondary | ICD-10-CM

## 2017-09-17 NOTE — Progress Notes (Signed)
Primary Care Physician: Marinda Elk, MD  Primary Gastroenterologist:  Dr. Jonathon Bellows   Chief Complaint  Patient presents with  . Gastroesophageal Reflux  . Abdominal Pain    HPI: Brianna [place] is a 61 y.o. female here for follow up. She has history of peptic ulcer disease with details outlined below, history of end-stage renal disease on dialysis, accompanied by her sister today for follow-up of upper abdominal pain. He went to primary care on 08/15/2017 with complaints of abdominal pain during and after dialysis. She has been off protonix for several months. She points her pain at left lower rib cage. She is restarted on protonix daily and which seemed to help with her upper abdominal pain. Per her sister, she is not complaining of frequent abdominal pain other than during dialysis. She is having good appetite, weight is stable, denies nausea, vomiting, melena. She is not taking any NSAIDs.   She was admitted to the hospital on 12/18/16 for hematemesis with a prior history of using Alleve, EGD revealed 3 non bleeding superficial ulcers in her stomach, and repeat EGD confirm healing of the ulcers and biopsies revealed H. Pylori infection. She was treated with triple therapy and confirmed eradication on H. Pylori stool antigen test.    Current Outpatient Prescriptions  Medication Sig Dispense Refill  . acetaminophen (TYLENOL) 325 MG tablet Take 325 mg by mouth.    . AFLURIA QUADRIVALENT 0.5 ML injection TO BE ADMINISTERED BY PHARMACIST FOR IMMUNIZATION  0  . amLODipine (NORVASC) 5 MG tablet Take 10 mg by mouth daily.     Marland Kitchen ethyl chloride spray Apply topically.    . furosemide (LASIX) 40 MG tablet TAKE 1 TABLET (40 MG TOTAL) BY MOUTH TWO (2) TIMES A DAY.  11  . nystatin (NYSTATIN) powder Apply thin layer to affected area up to 2 times daily as needed. 30 g 0  . pantoprazole (PROTONIX) 40 MG tablet Take 1 tablet (40 mg total) by mouth 2 (two) times daily before a meal. 60  tablet 0  . sevelamer carbonate (RENVELA) 800 MG tablet Take by mouth.    . valsartan (DIOVAN) 160 MG tablet Take 160 mg by mouth daily.  3  . vitamin B-12 (CYANOCOBALAMIN) 1000 MCG tablet Take 1,000 mcg by mouth daily.    Marland Kitchen aspirin EC 81 MG tablet Take by mouth.     No current facility-administered medications for this visit.     Allergies as of 09/17/2017 - Review Complete 09/17/2017  Allergen Reaction Noted  . Sulfa antibiotics Other (See Comments) 11/30/2014  . Penicillins Rash 09/17/2017    ROS:  General: Negative for anorexia, weight loss, fever, chills, fatigue, weakness. ENT: Negative for hoarseness, difficulty swallowing , nasal congestion. CV: Negative for chest pain, angina, palpitations, dyspnea on exertion, peripheral edema.  Respiratory: Negative for dyspnea at rest, dyspnea on exertion, cough, sputum, wheezing.  GI: See history of present illness. GU:  Negative for dysuria, hematuria, urinary incontinence, urinary frequency, nocturnal urination.  Endo: Negative for unusual weight change.    Physical Examination:   BP (!) 153/82   Pulse 71   Temp 98.3 F (36.8 C) (Oral)   Ht 5' (1.524 m)   Wt 133 lb 9.6 oz (60.6 kg)   BMI 26.09 kg/m   General: Well-nourished, well-developed in no acute distress.  Eyes: No icterus. Conjunctivae pink. Mouth: Oropharyngeal mucosa moist and pink , no lesions erythema or exudate. Lungs: Clear to auscultation bilaterally. Non-labored. Heart: Regular rate  and rhythm, no murmurs rubs or gallops.  Chest: She does have tenderness in the left lower ribs Abdomen: Bowel sounds are normal, nontender, nondistended, no hepatosplenomegaly or masses, no abdominal bruits or hernia , no rebound or guarding.   Extremities: No lower extremity edema. No clubbing or deformities. Neuro: Alert and oriented x 3.  Grossly intact. Skin: Warm and dry, no jaundice.   Psych: Alert and cooperative, normal mood and affect.  Labs:  CBC Latest Ref Rng &  Units 08/28/2017 03/11/2017 02/20/2017  WBC 3.6 - 11.0 K/uL 5.1 6.7 6.1  Hemoglobin 12.0 - 16.0 g/dL 13.1 14.1 13.8  Hematocrit 35.0 - 47.0 % 37.8 42.7 41.2  Platelets 150 - 440 K/uL 171 179 179    Imaging Studies: Dg Chest 2 View  Result Date: 08/28/2017 CLINICAL DATA:  Pt arrived via EMS from dialysis for reports of abdominal pain, back pain and leg pain. EMS reports pt completed dialysis treatment prior to calling EMS. EMS reports VSS, NSR on monitor.Pt reports she usually feels bad after dialysis. EXAM: CHEST  2 VIEW COMPARISON:  12/18/2016 FINDINGS: The cardiac silhouette is mildly enlarged. No mediastinal or hilar masses. No evidence of adenopathy. Clear lungs.  No pleural effusion or pneumothorax. Stable right subclavian and axillary vascular stent. Skeletal structures are demineralized intact. IMPRESSION: No acute cardiopulmonary disease. Electronically Signed   By: Lajean Manes M.D.   On: 08/28/2017 15:14   Mm Digital Screening Bilateral  Result Date: 09/03/2017 CLINICAL DATA:  Screening. EXAM: DIGITAL SCREENING BILATERAL MAMMOGRAM WITH CAD COMPARISON:  Previous exam(s). ACR Breast Density Category b: There are scattered areas of fibroglandular density. FINDINGS: There are no findings suspicious for malignancy. Images were processed with CAD. IMPRESSION: No mammographic evidence of malignancy. A result letter of this screening mammogram will be mailed directly to the patient. RECOMMENDATION: Screening mammogram in one year. (Code:SM-B-01Y) BI-RADS CATEGORY  1: Negative. Electronically Signed   By: Evangeline Dakin M.D.   On: 09/03/2017 16:29    Assessment and Plan:   Brianna [place] is a 61 y.o. y/o female with history of gastric ulcers secondary to H. Pylori infection, status post triple therapy and confirmed eradication by H. Pylori stool antigen test. She is here with 1 month history of upper abdominal pain.The pain appears to be mostly musculoskeletal like costochondritis based on my  exam today. I suggested her to take ibuprofen once or twice daily for 2-3 days with food. She can continue Protonix for one more month and stop.   Plan   1. Treat costochondritis with 2-3 days of ibuprofen 1-2 times daily 2. Continue Protonix daily before breakfast 3. Follow-up with Dr. Vicente Males as needed  Cephas Darby, MD [address]  [place]  [place], [place] [address]  Main: [phone]  Fax: [phone] Pager: [phone]

## 2017-12-19 ENCOUNTER — Encounter: Payer: Self-pay | Admitting: Gastroenterology

## 2017-12-19 ENCOUNTER — Encounter (INDEPENDENT_AMBULATORY_CARE_PROVIDER_SITE_OTHER): Payer: Self-pay

## 2017-12-19 ENCOUNTER — Ambulatory Visit (INDEPENDENT_AMBULATORY_CARE_PROVIDER_SITE_OTHER): Payer: Medicare Other | Admitting: Gastroenterology

## 2017-12-19 DIAGNOSIS — R112 Nausea with vomiting, unspecified: Secondary | ICD-10-CM

## 2017-12-19 DIAGNOSIS — R1012 Left upper quadrant pain: Secondary | ICD-10-CM | POA: Diagnosis not present

## 2017-12-19 NOTE — Progress Notes (Signed)
Primary Care Physician: Marinda Elk, MD  Primary Gastroenterologist:  Dr. Jonathon Bellows   Chief Complaint  Patient presents with  . Abdominal Pain    x3 weeks, left side, vomiting, white "seeds" in stool    HPI: Brianna [place] is a 62 y.o. female here for follow up. She has history of peptic ulcer disease with details outlined below, history of end-stage renal disease on dialysis, accompanied by her sister today for follow-up of upper abdominal pain. He went to primary care on 08/15/2017 with complaints of abdominal pain during and after dialysis. She has been off protonix for several months. She points her pain at left lower rib cage. She is restarted on protonix daily and which seemed to help with her upper abdominal pain. Per her sister, she is not complaining of frequent abdominal pain other than during dialysis. She is having good appetite, weight is stable, denies nausea, vomiting, melena. She is not taking any NSAIDs.  She was admitted to the hospital on 12/18/16 for hematemesis with a prior history of using Alleve, EGD revealed 3 non bleeding superficial ulcers in her stomach, and repeat EGD confirm healing of the ulcers and biopsies revealed H. Pylori infection. She was treated with triple therapy and confirmed eradication on H. Pylori stool antigen test.   Follow-up visit 12/19/17: Patient reports 3 weeks history of left-sided upper abdominal/flank pain associated with sporadic episodes of nonbloody emesis. She reports that her bowel movements are fairly regular. She denies aggravating or relieving factors, no relation to food. She thinks she has a bruise there. She denies losing weight, loss of appetite. She denies rectal bleeding or diarrhea. She denies having pain today. She is accompanied by her caregiver  GI procedures: Colonoscopy was at least 10 years ago    Current Outpatient Medications  Medication Sig Dispense Refill  . acetaminophen (TYLENOL) 325 MG tablet  Take 325 mg by mouth.    . AFLURIA QUADRIVALENT 0.5 ML injection TO BE ADMINISTERED BY PHARMACIST FOR IMMUNIZATION  0  . amLODipine (NORVASC) 5 MG tablet Take 10 mg by mouth daily.     Marland Kitchen ethyl chloride spray Apply topically.    . furosemide (LASIX) 40 MG tablet TAKE 1 TABLET (40 MG TOTAL) BY MOUTH TWO (2) TIMES A DAY.  11  . pantoprazole (PROTONIX) 40 MG tablet Take 1 tablet (40 mg total) by mouth 2 (two) times daily before a meal. 60 tablet 0  . sevelamer carbonate (RENVELA) 800 MG tablet Take by mouth.    . valsartan (DIOVAN) 160 MG tablet Take 160 mg by mouth daily.  3  . vitamin B-12 (CYANOCOBALAMIN) 1000 MCG tablet Take 1,000 mcg by mouth daily.     No current facility-administered medications for this visit.     Allergies as of 12/19/2017 - Review Complete 12/19/2017  Allergen Reaction Noted  . Sulfa antibiotics Other (See Comments) 11/30/2014  . Penicillins Rash 09/17/2017    ROS:  General: Negative for anorexia, weight loss, fever, chills, fatigue, weakness. ENT: Negative for hoarseness, difficulty swallowing , nasal congestion. CV: Negative for chest pain, angina, palpitations, dyspnea on exertion, peripheral edema.  Respiratory: Negative for dyspnea at rest, dyspnea on exertion, cough, sputum, wheezing.  GI: See history of present illness. GU:  Negative for dysuria, hematuria, urinary incontinence, urinary frequency, nocturnal urination.  Endo: Negative for unusual weight change.    Physical Examination:   BP (!) 152/79   Pulse 76   Temp 98.3 F (36.8 C) (Oral)  Wt 132 lb 12.8 oz (60.2 kg)   BMI 25.94 kg/m   General: Well-nourished, well-developed in no acute distress.  Eyes: No icterus. Conjunctivae pink. Mouth: Oropharyngeal mucosa moist and pink , no lesions erythema or exudate. Lungs: Clear to auscultation bilaterally. Non-labored. Heart: Regular rate and rhythm, no murmurs rubs or gallops.  Chest: She does have tenderness in the left upper flank  area Abdomen: Bowel sounds are normal, nontender, nondistended, no hepatosplenomegaly or masses, no abdominal bruits or hernia , no rebound or guarding.   Extremities: No lower extremity edema. No clubbing or deformities. Neuro: Alert and oriented x 3.  Grossly intact. Skin: Warm and dry, no jaundice.   Psych: Alert and cooperative, normal mood and affect.  Labs:  CBC Latest Ref Rng & Units 08/28/2017 03/11/2017 02/20/2017  WBC 3.6 - 11.0 K/uL 5.1 6.7 6.1  Hemoglobin 12.0 - 16.0 g/dL 13.1 14.1 13.8  Hematocrit 35.0 - 47.0 % 37.8 42.7 41.2  Platelets 150 - 440 K/uL 171 179 179    Imaging Studies: No results found.  Assessment and Plan:   Brianna [place] is a 63 y.o. female with end-stage renal disease on hemodialysis, history of gastric ulcers secondary to H. Pylori infection, status post triple therapy and confirmed eradication by H. Pylori stool antigen test. She is here for follow-up of 3 weeks history of left-sided upper abdominal/flank pain.The pain appears to be mostly musculoskeletal like costochondritis based on my exam today.   Plan   1. CT A/P with contrast 2. Colonoscopy if CT unremarkable   Brianna Darby, MD [address]  [place]  [place], [place] [address]  Main: [phone]  Fax: [phone] Pager: [phone]

## 2017-12-31 ENCOUNTER — Ambulatory Visit: Payer: Medicare Other

## 2018-01-02 ENCOUNTER — Ambulatory Visit
Admission: RE | Admit: 2018-01-02 | Discharge: 2018-01-02 | Disposition: A | Discharge status: Home / Self Care | Payer: Medicare Other | Source: Ambulatory Visit | Attending: Gastroenterology | Admitting: Gastroenterology

## 2018-01-02 DIAGNOSIS — R1012 Left upper quadrant pain: Secondary | ICD-10-CM

## 2018-01-02 DIAGNOSIS — Z905 Acquired absence of kidney: Secondary | ICD-10-CM | POA: Diagnosis not present

## 2018-01-02 DIAGNOSIS — R112 Nausea with vomiting, unspecified: Secondary | ICD-10-CM | POA: Diagnosis present

## 2018-01-02 DIAGNOSIS — I517 Cardiomegaly: Secondary | ICD-10-CM | POA: Diagnosis not present

## 2018-01-02 DIAGNOSIS — N2889 Other specified disorders of kidney and ureter: Secondary | ICD-10-CM | POA: Insufficient documentation

## 2018-01-02 DIAGNOSIS — I700 Atherosclerosis of aorta: Secondary | ICD-10-CM | POA: Diagnosis not present

## 2018-01-02 MED ORDER — IOPAMIDOL (ISOVUE-300) INJECTION 61%
75.0000 mL | Freq: Once | INTRAVENOUS | Status: AC | PRN
  Administered 2018-01-02: 75 mL via INTRAVENOUS

## 2018-01-07 ENCOUNTER — Other Ambulatory Visit: Payer: Self-pay

## 2018-01-07 DIAGNOSIS — Z1211 Encounter for screening for malignant neoplasm of colon: Secondary | ICD-10-CM

## 2018-02-04 ENCOUNTER — Ambulatory Visit
Admission: RE | Admit: 2018-02-04 | Discharge: 2018-02-04 | Disposition: A | Discharge status: Home / Self Care | Payer: Medicare Other | Source: Ambulatory Visit | Attending: Gastroenterology | Admitting: Gastroenterology

## 2018-02-04 ENCOUNTER — Encounter: Payer: Self-pay | Admitting: Student

## 2018-02-04 ENCOUNTER — Other Ambulatory Visit: Payer: Self-pay

## 2018-02-04 ENCOUNTER — Encounter
Admission: RE | Disposition: A | Discharge status: Home / Self Care | Payer: Self-pay | Source: Ambulatory Visit | Attending: Gastroenterology

## 2018-02-04 ENCOUNTER — Ambulatory Visit: Payer: Medicare Other | Admitting: Anesthesiology

## 2018-02-04 DIAGNOSIS — Z992 Dependence on renal dialysis: Secondary | ICD-10-CM | POA: Diagnosis not present

## 2018-02-04 DIAGNOSIS — Z79899 Other long term (current) drug therapy: Secondary | ICD-10-CM | POA: Diagnosis not present

## 2018-02-04 DIAGNOSIS — Z8711 Personal history of peptic ulcer disease: Secondary | ICD-10-CM | POA: Diagnosis not present

## 2018-02-04 DIAGNOSIS — K621 Rectal polyp: Secondary | ICD-10-CM

## 2018-02-04 DIAGNOSIS — K635 Polyp of colon: Secondary | ICD-10-CM | POA: Diagnosis not present

## 2018-02-04 DIAGNOSIS — D124 Benign neoplasm of descending colon: Secondary | ICD-10-CM

## 2018-02-04 DIAGNOSIS — K219 Gastro-esophageal reflux disease without esophagitis: Secondary | ICD-10-CM | POA: Insufficient documentation

## 2018-02-04 DIAGNOSIS — Z1211 Encounter for screening for malignant neoplasm of colon: Secondary | ICD-10-CM

## 2018-02-04 DIAGNOSIS — D125 Benign neoplasm of sigmoid colon: Secondary | ICD-10-CM

## 2018-02-04 DIAGNOSIS — N289 Disorder of kidney and ureter, unspecified: Secondary | ICD-10-CM | POA: Diagnosis not present

## 2018-02-04 DIAGNOSIS — I10 Essential (primary) hypertension: Secondary | ICD-10-CM | POA: Insufficient documentation

## 2018-02-04 HISTORY — PX: COLONOSCOPY WITH PROPOFOL: SHX5780

## 2018-02-04 HISTORY — DX: Chronic or unspecified gastric ulcer with hemorrhage: K25.4

## 2018-02-04 SURGERY — COLONOSCOPY WITH PROPOFOL
Anesthesia: General

## 2018-02-04 MED ORDER — LIDOCAINE HCL (PF) 2 % IJ SOLN
INTRAMUSCULAR | Status: AC
  Filled 2018-02-04: qty 10

## 2018-02-04 MED ORDER — PROPOFOL 10 MG/ML IV BOLUS
INTRAVENOUS | Status: AC
  Filled 2018-02-04: qty 40

## 2018-02-04 MED ORDER — PROPOFOL 10 MG/ML IV BOLUS
INTRAVENOUS | Status: DC | PRN
  Administered 2018-02-04: 70 mg via INTRAVENOUS
  Administered 2018-02-04 (×2): 30 mg via INTRAVENOUS
  Administered 2018-02-04: 50 mg via INTRAVENOUS
  Administered 2018-02-04 (×2): 30 mg via INTRAVENOUS

## 2018-02-04 MED ORDER — SODIUM CHLORIDE 0.9 % IV SOLN
INTRAVENOUS | Status: DC | PRN
  Administered 2018-02-04: 13:00:00 via INTRAVENOUS

## 2018-02-04 MED ORDER — PANTOPRAZOLE SODIUM 40 MG PO TBEC: 40.0000 mg | DELAYED_RELEASE_TABLET | Freq: Every day | ORAL | 0 refills | Status: DC

## 2018-02-04 MED ORDER — LIDOCAINE HCL (CARDIAC) 20 MG/ML IV SOLN
INTRAVENOUS | Status: DC | PRN
  Administered 2018-02-04: 50 mg via INTRAVENOUS

## 2018-02-04 NOTE — Anesthesia Post-op Follow-up Note (Signed)
Anesthesia QCDR form completed.        

## 2018-02-04 NOTE — Anesthesia Preprocedure Evaluation (Signed)
Anesthesia Evaluation  Patient identified by MRN, date of birth, ID band Patient awake    Reviewed: Allergy & Precautions, H&P , NPO status , Patient's Chart, lab work & pertinent test results, reviewed documented beta blocker date and time   Airway Mallampati: II   Neck ROM: full    Dental  (+) Poor Dentition   Pulmonary neg pulmonary ROS,    Pulmonary exam normal        Cardiovascular hypertension, negative cardio ROS Normal cardiovascular exam Rhythm:regular Rate:Normal     Neuro/Psych PSYCHIATRIC DISORDERS negative neurological ROS  negative psych ROS   GI/Hepatic negative GI ROS, Neg liver ROS, PUD, GERD  ,  Endo/Other  negative endocrine ROS  Renal/GU ESRFRenal diseasenegative Renal ROS  negative genitourinary   Musculoskeletal negative musculoskeletal ROS (+)   Abdominal   Peds negative pediatric ROS (+)  Hematology negative hematology ROS (+) anemia ,   Anesthesia Other Findings Past Medical History: No date: Dialysis patient ([place]) No date: Hypertension No date: Renal disorder Past Surgical History: No date: ABDOMINAL HYSTERECTOMY 12/18/2016: ESOPHAGOGASTRODUODENOSCOPY (EGD) WITH PROPOFOL N/A     Comment: Procedure: ESOPHAGOGASTRODUODENOSCOPY (EGD)               WITH PROPOFOL;  Surgeon: Jonathon Bellows, MD;                Location: [facility] ENDOSCOPY;  Service: Endoscopy;               Laterality: N/A; BMI    Body Mass Index:  26.76 kg/m     Reproductive/Obstetrics negative OB ROS                             Anesthesia Physical  Anesthesia Plan  ASA: III  Anesthesia Plan: General   Post-op Pain Management:    Induction:   PONV Risk Score and Plan:   Airway Management Planned:   Additional Equipment:   Intra-op Plan:   Post-operative Plan:   Informed Consent: I have reviewed the patients History and Physical, chart, labs and discussed the procedure including the  risks, benefits and alternatives for the proposed anesthesia with the patient or authorized representative who has indicated his/her understanding and acceptance.   Dental Advisory Given  Plan Discussed with: CRNA  Anesthesia Plan Comments:         Anesthesia Quick Evaluation

## 2018-02-04 NOTE — Op Note (Signed)
[facility] Gastroenterology Patient Name: Brianna Hamilton Procedure Date: 02/04/2018 1:18 PM MRN: [id number] Account #: [id number] Date of Birth: 03-30-1956 Admit Type: Outpatient Age: 62 Room: [facility] ENDO ROOM 4 Gender: Female Note Status: Finalized Procedure:            Colonoscopy Indications:          Screening for colorectal malignant neoplasm, Last                        colonoscopy: January 2014 Providers:            Lin Landsman MD, MD Referring MD:         Precious Bard, MD (Referring MD) Medicines:            Monitored Anesthesia Care Complications:        No immediate complications. Estimated blood loss:                        Minimal. Procedure:            Pre-Anesthesia Assessment:                       - Prior to the procedure, a History and Physical was                        performed, and patient medications and allergies were                        reviewed. The patient is competent. The risks and                        benefits of the procedure and the sedation options and                        risks were discussed with the patient. All questions                        were answered and informed consent was obtained.                        Patient identification and proposed procedure were                        verified by the physician, the nurse, the                        anesthesiologist, the anesthetist and the technician in                        the pre-procedure area in the procedure room in the                        endoscopy suite. Mental Status Examination: alert and                        oriented. Airway Examination: normal oropharyngeal                        airway and neck mobility. Respiratory Examination:  clear to auscultation. CV Examination: normal.                        Prophylactic Antibiotics: The patient does not require                        prophylactic antibiotics. Prior  Anticoagulants: The                        patient has taken no previous anticoagulant or                        antiplatelet agents. ASA Grade Assessment: III - A                        patient with severe systemic disease. After reviewing                        the risks and benefits, the patient was deemed in                        satisfactory condition to undergo the procedure. The                        anesthesia plan was to use monitored anesthesia care                        (MAC). Immediately prior to administration of                        medications, the patient was re-assessed for adequacy                        to receive sedatives. The heart rate, respiratory rate,                        oxygen saturations, blood pressure, adequacy of                        pulmonary ventilation, and response to care were                        monitored throughout the procedure. The physical status                        of the patient was re-assessed after the procedure.                       After obtaining informed consent, the colonoscope was                        passed under direct vision. Throughout the procedure,                        the patient's blood pressure, pulse, and oxygen                        saturations were monitored continuously. The                        Colonoscope  was introduced through the anus and                        advanced to the the terminal ileum. The colonoscopy was                        performed without difficulty. The patient tolerated the                        procedure well. The quality of the bowel preparation                        was evaluated using the BBPS [facility] Bowel Preparation                        Scale) with scores of: Right Colon = 3, Transverse                        Colon = 3 and Left Colon = 3 (entire mucosa seen well                        with no residual staining, small fragments of stool or                        opaque  liquid). The total BBPS score equals 9. Findings:      The perianal and digital rectal examinations were normal. Pertinent       negatives include normal sphincter tone and no palpable rectal lesions.      The terminal ileum appeared normal.      Four sessile polyps were found in the rectum, sigmoid colon and       descending colon. The polyps were 3 to 5 mm in size. These polyps were       removed with a cold snare. Resection and retrieval were complete.      The retroflexed view of the distal rectum and anal verge was normal and       showed no anal or rectal abnormalities. Impression:           - The examined portion of the ileum was normal.                       - Four 3 to 5 mm polyps in the rectum, in the sigmoid                        colon and in the descending colon, removed with a cold                        snare. Resected and retrieved.                       - The distal rectum and anal verge are normal on                        retroflexion view. Recommendation:       - Discharge patient to home.                       - Resume previous diet today.                       -  Continue present medications.                       - Await pathology results.                       - Repeat colonoscopy in 5-10 years for surveillance                        based on pathology results. Procedure Code(s):    --- Professional ---                       [phone], Colonoscopy, flexible; with removal of tumor(s),                        polyp(s), or other lesion(s) by snare technique Diagnosis Code(s):    --- Professional ---                       Z12.11, Encounter for screening for malignant neoplasm                        of colon                       K62.1, Rectal polyp                       D12.5, Benign neoplasm of sigmoid colon                       D12.4, Benign neoplasm of descending colon CPT copyright 2016 American Medical Association. All rights reserved. The codes documented in this  report are preliminary and upon coder review may  be revised to meet current compliance requirements. Dr. Ulyess Mort Lin Landsman MD, MD 02/04/2018 1:56:29 PM This report has been signed electronically. Number of Addenda: 0 Note Initiated On: 02/04/2018 1:18 PM Scope Withdrawal Time: 0 hours 13 minutes 5 seconds  Total Procedure Duration: 0 hours 17 minutes 21 seconds       [facility]

## 2018-02-04 NOTE — Anesthesia Postprocedure Evaluation (Signed)
Anesthesia Post Note  Patient: Brianna Hamilton  Procedure(s) Performed: COLONOSCOPY WITH PROPOFOL (N/A )  Patient location during evaluation: PACU Anesthesia Type: General Level of consciousness: awake and alert and oriented Pain management: pain level controlled Vital Signs Assessment: post-procedure vital signs reviewed and stable Respiratory status: spontaneous breathing Cardiovascular status: blood pressure returned to baseline Anesthetic complications: no     Last Vitals:  Vitals:   02/04/18 1417 02/04/18 1427  BP: 136/66 (!) 150/65  Pulse: 66 64  Resp: 14 14  Temp:    SpO2: 100% 99%    Last Pain:  Vitals:   02/04/18 1427  TempSrc:   PainSc: 0-No pain                 Valeree Leidy

## 2018-02-04 NOTE — Transfer of Care (Signed)
Immediate Anesthesia Transfer of Care Note  Patient: Brianna Hamilton  Procedure(s) Performed: COLONOSCOPY WITH PROPOFOL (N/A )  Patient Location: PACU and Endoscopy Unit  Anesthesia Type:General  Level of Consciousness: awake  Airway & Oxygen Therapy: Patient Spontanous Breathing  Post-op Assessment: Report given to RN  Post vital signs: stable  Last Vitals:  Vitals:   02/04/18 1254  BP: (!) 142/89  Pulse: 84  Resp: 18  Temp: 36.6 C    Last Pain:  Vitals:   02/04/18 1254  TempSrc: Tympanic         Complications: No apparent anesthesia complications

## 2018-02-04 NOTE — H&P (Signed)
Cephas Darby, MD [address]  [place]  [place], [place] [address]  Main: [phone]  Fax: [phone] Pager: [phone]  Primary Care Physician:  Marinda Elk, MD Primary Gastroenterologist:  Dr. Cephas Darby  Pre-Procedure History & Physical: HPI:  Brianna Hamilton is a 62 y.o. female is here for an colonoscopy.   Past Medical History:  Diagnosis Date  . Bleeding stomach ulcer   . Dialysis patient ([place])   . GERD (gastroesophageal reflux disease)   . Hypertension   . Rectal prolapse   . Renal disorder     Past Surgical History:  Procedure Laterality Date  . ABDOMINAL HYSTERECTOMY    . ESOPHAGOGASTRODUODENOSCOPY (EGD) WITH PROPOFOL N/A 12/18/2016   Procedure: ESOPHAGOGASTRODUODENOSCOPY (EGD) WITH PROPOFOL;  Surgeon: Jonathon Bellows, MD;  Location: [facility] ENDOSCOPY;  Service: Endoscopy;  Laterality: N/A;  . ESOPHAGOGASTRODUODENOSCOPY (EGD) WITH PROPOFOL N/A 02/28/2017   Procedure: ESOPHAGOGASTRODUODENOSCOPY (EGD) WITH PROPOFOL;  Surgeon: Jonathon Bellows, MD;  Location: [facility] ENDOSCOPY;  Service: Endoscopy;  Laterality: N/A;    Prior to Admission medications   Medication Sig Start Date End Date Taking? Authorizing Provider  acetaminophen (TYLENOL) 325 MG tablet Take 325 mg by mouth.   Yes [provider]  amLODipine (NORVASC) 5 MG tablet Take 10 mg by mouth daily.  12/12/16  Yes [provider]  furosemide (LASIX) 40 MG tablet TAKE 1 TABLET (40 MG TOTAL) BY MOUTH TWO (2) TIMES A DAY. 09/01/17  Yes [provider]  pantoprazole (PROTONIX) 40 MG tablet Take 1 tablet (40 mg total) by mouth 2 (two) times daily before a meal. 12/19/16  Yes Sudini, Alveta Heimlich, MD  sevelamer carbonate (RENVELA) 800 MG tablet Take by mouth.   Yes [provider]  valsartan (DIOVAN) 160 MG tablet Take 160 mg by mouth daily. 02/04/17  Yes [provider]  vitamin B-12 (CYANOCOBALAMIN) 1000 MCG tablet Take 1,000 mcg by mouth daily.   Yes [provider]  AFLURIA QUADRIVALENT 0.5 ML injection TO BE ADMINISTERED BY PHARMACIST FOR IMMUNIZATION 08/09/17   [provider]  ethyl chloride spray Apply topically. 08/20/16   [provider]    Allergies as of 01/07/2018 - Review Complete 01/02/2018  Allergen Reaction Noted  . Sulfa antibiotics Other (See Comments) 11/30/2014  . Penicillins Rash 09/17/2017    History reviewed. No pertinent family history.  Social History   Socioeconomic History  . Marital status: Single    Spouse name: Not on file  . Number of children: Not on file  . Years of education: Not on file  . Highest education level: Not on file  Social Needs  . Financial resource strain: Not on file  . Food insecurity - worry: Not on file  . Food insecurity - inability: Not on file  . Transportation needs - medical: Not on file  . Transportation needs - non-medical: Not on file  Occupational History  . Not on file  Tobacco Use  . Smoking status: Never Smoker  . Smokeless tobacco: Never Used  Substance and Sexual Activity  . Alcohol use: No  . Drug use: No  . Sexual activity: No  Other Topics Concern  . Not on file  Social History Narrative  . Not on file    Review of Systems: See HPI, otherwise negative ROS  Physical Exam: BP (!) 142/89   Pulse 84   Temp 97.8 F (36.6 C) (Tympanic)   Resp 18   Ht 5' (1.524 m)   Wt 132 lb (  59.9 kg)   BMI 25.78 kg/m  General:   Alert,  pleasant and cooperative in NAD Head:  Normocephalic and atraumatic. Neck:  Supple; no masses or thyromegaly. Lungs:  Clear throughout to auscultation.    Heart:  Regular rate and rhythm. Abdomen:  Soft, nontender and nondistended. Normal bowel sounds, without guarding, and without rebound.   Neurologic:  Alert and  oriented x4;  grossly normal neurologically.  Impression/Plan: Brianna Hamilton is here for an colonoscopy to be performed for colon cancer screening  Risks, benefits, limitations, and  alternatives regarding  colonoscopy have been reviewed with the patient.  Questions have been answered.  All parties agreeable.   Sherri Sear, MD  02/04/2018, 1:08 PM

## 2018-02-05 ENCOUNTER — Encounter: Payer: Self-pay | Admitting: Gastroenterology

## 2018-02-06 LAB — SURGICAL PATHOLOGY

## 2018-02-10 ENCOUNTER — Encounter: Payer: Self-pay | Admitting: Gastroenterology

## 2018-04-11 ENCOUNTER — Other Ambulatory Visit: Payer: Self-pay | Admitting: Gastroenterology

## 2018-04-24 ENCOUNTER — Encounter: Payer: Self-pay | Admitting: Emergency Medicine

## 2018-04-24 ENCOUNTER — Other Ambulatory Visit: Payer: Self-pay

## 2018-04-24 ENCOUNTER — Emergency Department
Admission: EM | Admit: 2018-04-24 | Discharge: 2018-04-24 | Disposition: A | Discharge status: Home / Self Care | Payer: Medicare Other | Attending: Emergency Medicine | Admitting: Emergency Medicine

## 2018-04-24 DIAGNOSIS — N186 End stage renal disease: Secondary | ICD-10-CM

## 2018-04-24 DIAGNOSIS — Y829 Unspecified medical devices associated with adverse incidents: Secondary | ICD-10-CM | POA: Diagnosis not present

## 2018-04-24 DIAGNOSIS — Z992 Dependence on renal dialysis: Secondary | ICD-10-CM | POA: Diagnosis not present

## 2018-04-24 DIAGNOSIS — Z79899 Other long term (current) drug therapy: Secondary | ICD-10-CM | POA: Insufficient documentation

## 2018-04-24 DIAGNOSIS — T82898A Other specified complication of vascular prosthetic devices, implants and grafts, initial encounter: Secondary | ICD-10-CM

## 2018-04-24 DIAGNOSIS — I120 Hypertensive chronic kidney disease with stage 5 chronic kidney disease or end stage renal disease: Secondary | ICD-10-CM | POA: Insufficient documentation

## 2018-04-24 DIAGNOSIS — T82838A Hemorrhage of vascular prosthetic devices, implants and grafts, initial encounter: Secondary | ICD-10-CM | POA: Diagnosis present

## 2018-04-24 LAB — CBC WITH DIFFERENTIAL/PLATELET
BASOS ABS: 0.1 10*3/uL (ref 0–0.1)
Basophils Relative: 1 %
EOS ABS: 0.2 10*3/uL (ref 0–0.7)
EOS PCT: 3 %
HCT: 31.6 % — ABNORMAL LOW (ref 35.0–47.0)
Hemoglobin: 10.6 g/dL — ABNORMAL LOW (ref 12.0–16.0)
Lymphocytes Relative: 18 %
Lymphs Abs: 1.3 10*3/uL (ref 1.0–3.6)
MCH: 31.9 pg (ref 26.0–34.0)
MCHC: 33.6 g/dL (ref 32.0–36.0)
MCV: 94.7 fL (ref 80.0–100.0)
Monocytes Absolute: 0.4 10*3/uL (ref 0.2–0.9)
Monocytes Relative: 6 %
NEUTROS PCT: 72 %
Neutro Abs: 5.3 10*3/uL (ref 1.4–6.5)
PLATELETS: 186 10*3/uL (ref 150–440)
RBC: 3.34 MIL/uL — AB (ref 3.80–5.20)
RDW: 13.7 % (ref 11.5–14.5)
WBC: 7.4 10*3/uL (ref 3.6–11.0)

## 2018-04-24 LAB — BASIC METABOLIC PANEL
Anion gap: 9 (ref 5–15)
BUN: 47 mg/dL — AB (ref 6–20)
CO2: 29 mmol/L (ref 22–32)
CREATININE: 4.11 mg/dL — AB (ref 0.44–1.00)
Calcium: 8.3 mg/dL — ABNORMAL LOW (ref 8.9–10.3)
Chloride: 100 mmol/L — ABNORMAL LOW (ref 101–111)
GFR, EST AFRICAN AMERICAN: 12 mL/min — AB (ref >60)
GFR, EST NON AFRICAN AMERICAN: 11 mL/min — AB (ref >60)
Glucose, Bld: 103 mg/dL — ABNORMAL HIGH (ref 65–99)
POTASSIUM: 4.4 mmol/L (ref 3.5–5.1)
SODIUM: 138 mmol/L (ref 135–145)

## 2018-04-24 LAB — PROTIME-INR
INR: 0.97
PROTHROMBIN TIME: 12.8 s (ref 11.4–15.2)

## 2018-04-24 LAB — APTT: APTT: 25 s (ref 24–36)

## 2018-04-24 NOTE — ED Provider Notes (Addendum)
[facility] Emergency Department Provider Note  ____________________________________________  Time seen: Approximately 8:04 PM  I have reviewed the triage vital signs and the nursing notes.   HISTORY  Chief Complaint Vascular Access Problem and Bleeding  Level 5 Caveat: Portions of the History and Physical are unable to be obtained due to patient being a poor historian family member at bedside to provide additional history  HPI Brianna Hamilton is a 62 y.o. female with a history of end-stage renal disease on hemodialysis brought to the ED for evaluation of bleeding from her AV fistula that she uses for dialysis. She is managed by a vascular surgeon in [facility]. She gets dialysis Monday Wednesday Friday and last went yesterday. She has a history of angioplasty 6 weeks ago due to proximal stricture.   today the patient had bleeding from her right upper extremity AV fistula over his site of button hole that used to be the primary access point, but is now being allowed to heal with plans to use more proximal locations on the fistula. She denies trauma or scratching, but did note bleeding from the fistula at high pressure today while she was in the bathroom. This was wrapped in gauze and seems to have stopped at this time, but they're worried that it may bleed again. She didn't go back to dialysis tomorrow morning at 6:00 a.m., and then has an 11:30a.m. appointment with her vascular surgeon tomorrow.  no pain. No hand weakness or coldness. No fevers chills chest pain or shortness of breath.   Past Medical History:  Diagnosis Date  . Bleeding stomach ulcer   . Dialysis patient ([place])   . GERD (gastroesophageal reflux disease)   . Hypertension   . Rectal prolapse   . Renal disorder      Patient Active Problem List   Diagnosis Date Noted  . Colon cancer screening   . Delayed gastric emptying 01/23/2017  . Hypertension 01/23/2017  . Solitary kidney,  congenital 01/23/2017  . 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 Duodenal ulcer 12/19/2016  . Acute blood loss anemia 12/19/2016  . GIB (gastrointestinal bleeding) 12/18/2016  . Moderate mitral insufficiency 03/08/2015  . Chest pain 02/28/2015  . Moderate tricuspid insufficiency 02/22/2015  . 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  . Primary chronic pseudo-obstruction of stomach 11/03/2014  . Anemia 05/31/2014  . Hyperparathyroidism , secondary, non-renal ([place]) 05/31/2014  . 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 Hearing loss 08/15/2011  . Chronic kidney disease, stage IV (severe) ([place]) 08/14/2011  . 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  . Low serum vitamin D 05/11/2008  . Diverticulosis 03/01/2008     Past Surgical History:  Procedure Laterality Date  . ABDOMINAL HYSTERECTOMY    . COLONOSCOPY WITH PROPOFOL N/A 02/04/2018   Procedure: COLONOSCOPY WITH PROPOFOL;  Surgeon: Lin Landsman, MD;  Location[facility] ENDOSCOPY;  Service: Gastroenterology;  Laterality: N/A;  . ESOPHAGOGASTRODUODENOSCOPY (EGD) WITH PROPOFOL N/A 12/18/2016   Procedure: ESOPHAGOGASTRODUODENOSCOPY (EGD) WITH PROPOFOL;  Surgeon: Jonathon Bellows, MD;  Location: [facility] ENDOSCOPY;  Service: Endoscopy;  Laterality: N/A;  . ESOPHAGOGASTRODUODENOSCOPY (EGD) WITH PROPOFOL N/A 02/28/2017   Procedure: ESOPHAGOGASTRODUODENOSCOPY (EGD) WITH PROPOFOL;  Surgeon: Jonathon Bellows, MD;  Location: [facility] ENDOSCOPY;  Service: Endoscopy;  Laterality: N/A;     Prior to Admission medications   Medication Sig Start Date End Date Taking? Authorizing Provider  acetaminophen (TYLENOL) 325 MG tablet Take 325 mg by mouth.    [provider]  AFLURIA QUADRIVALENT 0.5 ML injection TO BE  ADMINISTERED BY PHARMACIST FOR IMMUNIZATION 08/09/17   [provider]  amLODipine (NORVASC) 5 MG tablet Take 10 mg by mouth daily.  12/12/16   [provider]  ethyl chloride spray Apply topically. 08/20/16   [provider]  furosemide (LASIX) 40 MG tablet TAKE 1 TABLET (40 MG TOTAL) BY MOUTH TWO (2) TIMES A DAY. 09/01/17   [provider]  pantoprazole (PROTONIX) 40 MG tablet TAKE 1 TABLET (40 MG TOTAL) BY MOUTH DAILY BEFORE BREAKFAST. 04/11/18   Lin Landsman, MD  sevelamer carbonate (RENVELA) 800 MG tablet Take by mouth.    [provider]  valsartan (DIOVAN) 160 MG tablet Take 160 mg by mouth daily. 02/04/17   [provider]  vitamin B-12 (CYANOCOBALAMIN) 1000 MCG tablet Take 1,000 mcg by mouth daily.    [provider]     Allergies Sulfa antibiotics and Penicillins   History reviewed. No pertinent family history.  Social History Social History   Tobacco Use  . Smoking status: Never Smoker  . Smokeless tobacco: Never Used  Substance Use Topics  . Alcohol use: No  . Drug use: No    Review of Systems  Constitutional:   No fever or chills.  ENT:   No sore throat. No rhinorrhea. Cardiovascular:   No chest pain or syncope. Respiratory:   No dyspnea or cough. Gastrointestinal:   Negative for abdominal pain, vomiting and diarrhea.  Musculoskeletal:   Negative for focal pain or swelling All other systems reviewed and are negative except as documented above in ROS and HPI.  ____________________________________________   PHYSICAL EXAM:  VITAL SIGNS: ED Triage Vitals  Enc Vitals Group     BP 04/24/18 1828 132/79     Pulse Rate 04/24/18 1828 91     Resp 04/24/18 1828 18     Temp 04/24/18 1828 98.7 F (37.1 C)     Temp Source 04/24/18 1828 Oral     SpO2 04/24/18 1828 99 %     Weight 04/24/18 1828 134 lb (60.8 kg)     Height 04/24/18 1828 5\' 5"  (1.651 m)     Head Circumference --      Peak Flow --      Pain Score 04/24/18 1848 0     Pain Loc --      Pain Edu? --      Excl. in [place]? --     Vital signs reviewed, nursing assessments reviewed.   Constitutional:   Alert and oriented. Well appearing and in no distress. Eyes:    Conjunctivae are normal. EOMI. PERRL.no pallor ENT      Head:   Normocephalic and atraumatic.      Nose:   No congestion/rhinnorhea.       Mouth/Throat:   MMM, no pharyngeal erythema. No peritonsillar mass.       Neck:   No meningismus. Full ROM. Hematological/Lymphatic/Immunilogical:   No cervical lymphadenopathy. Cardiovascular:   RRR. Symmetric bilateral radial and DP pulses.  No murmurs. . Extremity AV fistula with strong thrill and pulses. Intact radial and ulnar pulses and capillary refill in the fingers. No active bleeding from the fistula site. Respiratory:   Normal respiratory effort without tachypnea/retractions. Breath sounds are clear and equal bilaterally. No wheezes/rales/rhonchi. Gastrointestinal:   Soft and nontender. Non distended. There is no CVA tenderness.  No rebound, rigidity, or guarding.  Musculoskeletal:   Normal range of motion in all extremities. No joint effusions.  No lower extremity tenderness.  No edema.  Neurologic:   Normal speech and language.  Motor grossly intact. No acute focal neurologic deficits are appreciated.  Skin:    Skin is warm, dry and intact. No rash noted.  No petechiae, purpura, or bullae.  ____________________________________________    LABS (pertinent positives/negatives) (all labs ordered are listed, but only abnormal results are displayed) Labs Reviewed  CBC WITH DIFFERENTIAL/PLATELET - Abnormal; Notable for the following components:      Result Value   RBC 3.34 (*)    Hemoglobin 10.6 (*)    HCT 31.6 (*)    All other components within normal limits  BASIC METABOLIC PANEL  PROTIME-INR  APTT   ____________________________________________   EKG EKG interpreted by me during evaluation Normal sinus rhythm rate of 93, left axis, right bundle-branch block, no acute ischemic changes. Normal intervals. Normal ST segments.   ____________________________________________    RADIOLOGY  No results  found.  ____________________________________________   PROCEDURES Procedures  ____________________________________________    CLINICAL IMPRESSION / ASSESSMENT AND PLAN / ED COURSE  Pertinent labs & imaging results that were available during my care of the patient were reviewed by me and considered in my medical decision making (see chart for details).    patient well-appearing no acute distress, normal vital signs, currently hemostatic. No acute interventions at this time. I will check labs because I cannot find any previous results in the electronic medical record at this institution or other facilities. No evidence of blood thinner use. She needs nonurgent evaluation by vascular surgery, which she has artery arranged for tomorrow. If labs still reveal any acute concerns, she is suitable for discharge home to continue her routine dialysis and vascular follow-up tomorrow.      ____________________________________________   FINAL CLINICAL IMPRESSION(S) / ED DIAGNOSES    Final diagnoses:  End-stage renal disease on hemodialysis ([place])  Problem with dialysis access, initial encounter [facility])     ED Discharge Orders    None      Portions of this note were generated with dragon dictation software. Dictation errors may occur despite best attempts at proofreading.    Carrie Mew, MD 04/24/18 [place]    Carrie Mew, MD 04/24/18 2206    Carrie Mew, MD 04/30/18 1013

## 2018-04-24 NOTE — Discharge Instructions (Signed)
Follow up with your vascular surgeon tomorrow as scheduled.

## 2018-04-24 NOTE — ED Triage Notes (Addendum)
Pt presents to ED via POV with family member c/o bleeding from fistula site to R arm. Family state pt had a new access placed to R upper arm because staff stated old fistula was not usable anymore. Family report pt has had problems in the past with minor bleeding from old site that took a long time to stop, but has never had bleeding to this extent. Pt reports bathroom floor was covered with blood. Dried blood noted down pt's R arm/hand and on clothes. Pt's arm wrapped with rolled gauze in triage, bleeding controlled at this time. Denies blood thinner use.

## 2018-06-02 ENCOUNTER — Other Ambulatory Visit
Admission: RE | Admit: 2018-06-02 | Discharge: 2018-06-02 | Disposition: A | Discharge status: Home / Self Care | Payer: Medicare Other | Source: Ambulatory Visit | Attending: Nephrology | Admitting: Nephrology

## 2018-06-02 DIAGNOSIS — D631 Anemia in chronic kidney disease: Secondary | ICD-10-CM | POA: Insufficient documentation

## 2018-06-02 DIAGNOSIS — N184 Chronic kidney disease, stage 4 (severe): Secondary | ICD-10-CM | POA: Diagnosis present

## 2018-06-02 LAB — HEMOGLOBIN: HEMOGLOBIN: 7.4 g/dL — AB (ref 12.0–15.0)

## 2018-06-26 ENCOUNTER — Other Ambulatory Visit: Payer: Self-pay | Admitting: Gastroenterology

## 2018-09-01 ENCOUNTER — Other Ambulatory Visit: Payer: Self-pay | Admitting: Physician Assistant

## 2018-09-01 DIAGNOSIS — Z1231 Encounter for screening mammogram for malignant neoplasm of breast: Secondary | ICD-10-CM

## 2018-09-15 ENCOUNTER — Ambulatory Visit
Admission: RE | Admit: 2018-09-15 | Discharge: 2018-09-15 | Disposition: A | Discharge status: Home / Self Care | Payer: Medicare Other | Source: Ambulatory Visit | Attending: Physician Assistant | Admitting: Physician Assistant

## 2018-09-15 DIAGNOSIS — Z1231 Encounter for screening mammogram for malignant neoplasm of breast: Secondary | ICD-10-CM | POA: Diagnosis not present

## 2019-08-12 ENCOUNTER — Other Ambulatory Visit: Payer: Self-pay | Admitting: Physician Assistant

## 2019-08-12 DIAGNOSIS — Z1231 Encounter for screening mammogram for malignant neoplasm of breast: Secondary | ICD-10-CM

## 2019-09-17 ENCOUNTER — Ambulatory Visit
Admission: RE | Admit: 2019-09-17 | Discharge: 2019-09-17 | Disposition: A | Discharge status: Home / Self Care | Payer: Medicare Other | Source: Ambulatory Visit | Attending: Physician Assistant | Admitting: Physician Assistant

## 2019-09-17 DIAGNOSIS — Z1231 Encounter for screening mammogram for malignant neoplasm of breast: Secondary | ICD-10-CM | POA: Diagnosis present

## 2019-10-09 ENCOUNTER — Other Ambulatory Visit: Payer: Self-pay | Admitting: Gastroenterology

## 2020-08-11 ENCOUNTER — Other Ambulatory Visit: Payer: Self-pay | Admitting: Physician Assistant

## 2020-08-11 DIAGNOSIS — Z1231 Encounter for screening mammogram for malignant neoplasm of breast: Secondary | ICD-10-CM

## 2020-09-20 ENCOUNTER — Other Ambulatory Visit: Payer: Self-pay

## 2020-09-20 ENCOUNTER — Ambulatory Visit
Admission: RE | Admit: 2020-09-20 | Discharge: 2020-09-20 | Disposition: A | Discharge status: Home / Self Care | Payer: Medicare Other | Source: Ambulatory Visit | Attending: Physician Assistant | Admitting: Physician Assistant

## 2020-09-20 DIAGNOSIS — Z1231 Encounter for screening mammogram for malignant neoplasm of breast: Secondary | ICD-10-CM | POA: Diagnosis not present

## 2020-09-20 HISTORY — DX: Malignant (primary) neoplasm, unspecified: C80.1

## 2021-02-06 ENCOUNTER — Other Ambulatory Visit: Payer: Self-pay | Admitting: Physician Assistant

## 2021-02-06 DIAGNOSIS — R1011 Right upper quadrant pain: Secondary | ICD-10-CM

## 2021-02-07 ENCOUNTER — Ambulatory Visit: Payer: Medicare HMO

## 2021-11-28 ENCOUNTER — Emergency Department: Payer: Medicare HMO

## 2021-11-28 ENCOUNTER — Observation Stay
Admission: EM | Admit: 2021-11-28 | Discharge: 2021-12-01 | Disposition: A | Discharge status: Home Health | Payer: Medicare HMO | Attending: Internal Medicine | Admitting: Internal Medicine

## 2021-11-28 ENCOUNTER — Encounter: Payer: Self-pay | Admitting: Internal Medicine

## 2021-11-28 ENCOUNTER — Other Ambulatory Visit: Payer: Self-pay

## 2021-11-28 DIAGNOSIS — N184 Chronic kidney disease, stage 4 (severe): Secondary | ICD-10-CM | POA: Diagnosis not present

## 2021-11-28 DIAGNOSIS — Z992 Dependence on renal dialysis: Secondary | ICD-10-CM | POA: Diagnosis not present

## 2021-11-28 DIAGNOSIS — I82409 Acute embolism and thrombosis of unspecified deep veins of unspecified lower extremity: Secondary | ICD-10-CM | POA: Diagnosis present

## 2021-11-28 DIAGNOSIS — I10 Essential (primary) hypertension: Secondary | ICD-10-CM | POA: Diagnosis present

## 2021-11-28 DIAGNOSIS — I824Y2 Acute embolism and thrombosis of unspecified deep veins of left proximal lower extremity: Secondary | ICD-10-CM

## 2021-11-28 DIAGNOSIS — K269 Duodenal ulcer, unspecified as acute or chronic, without hemorrhage or perforation: Secondary | ICD-10-CM

## 2021-11-28 DIAGNOSIS — I824Z2 Acute embolism and thrombosis of unspecified deep veins of left distal lower extremity: Principal | ICD-10-CM | POA: Insufficient documentation

## 2021-11-28 DIAGNOSIS — H9190 Unspecified hearing loss, unspecified ear: Secondary | ICD-10-CM

## 2021-11-28 DIAGNOSIS — D631 Anemia in chronic kidney disease: Secondary | ICD-10-CM | POA: Diagnosis not present

## 2021-11-28 DIAGNOSIS — Z79899 Other long term (current) drug therapy: Secondary | ICD-10-CM | POA: Insufficient documentation

## 2021-11-28 DIAGNOSIS — I82402 Acute embolism and thrombosis of unspecified deep veins of left lower extremity: Secondary | ICD-10-CM | POA: Diagnosis present

## 2021-11-28 DIAGNOSIS — R296 Repeated falls: Secondary | ICD-10-CM | POA: Insufficient documentation

## 2021-11-28 DIAGNOSIS — W19XXXD Unspecified fall, subsequent encounter: Secondary | ICD-10-CM | POA: Diagnosis not present

## 2021-11-28 DIAGNOSIS — Z85828 Personal history of other malignant neoplasm of skin: Secondary | ICD-10-CM | POA: Diagnosis not present

## 2021-11-28 DIAGNOSIS — H9193 Unspecified hearing loss, bilateral: Secondary | ICD-10-CM

## 2021-11-28 DIAGNOSIS — R2242 Localized swelling, mass and lump, left lower limb: Secondary | ICD-10-CM | POA: Diagnosis present

## 2021-11-28 DIAGNOSIS — N185 Chronic kidney disease, stage 5: Secondary | ICD-10-CM | POA: Diagnosis not present

## 2021-11-28 DIAGNOSIS — I120 Hypertensive chronic kidney disease with stage 5 chronic kidney disease or end stage renal disease: Secondary | ICD-10-CM | POA: Diagnosis not present

## 2021-11-28 DIAGNOSIS — F70 Mild intellectual disabilities: Secondary | ICD-10-CM

## 2021-11-28 DIAGNOSIS — D649 Anemia, unspecified: Secondary | ICD-10-CM | POA: Diagnosis present

## 2021-11-28 DIAGNOSIS — M7989 Other specified soft tissue disorders: Secondary | ICD-10-CM | POA: Diagnosis present

## 2021-11-28 DIAGNOSIS — W19XXXA Unspecified fall, initial encounter: Secondary | ICD-10-CM | POA: Diagnosis present

## 2021-11-28 LAB — CBC WITH DIFFERENTIAL/PLATELET
Abs Immature Granulocytes: 0.04 10*3/uL (ref 0.00–0.07)
Basophils Absolute: 0 10*3/uL (ref 0.0–0.1)
Basophils Relative: 0 %
Eosinophils Absolute: 0.1 10*3/uL (ref 0.0–0.5)
Eosinophils Relative: 2 %
HCT: 28.6 % — ABNORMAL LOW (ref 36.0–46.0)
Hemoglobin: 9.5 g/dL — ABNORMAL LOW (ref 12.0–15.0)
Immature Granulocytes: 1 %
Lymphocytes Relative: 24 %
Lymphs Abs: 0.8 10*3/uL (ref 0.7–4.0)
MCH: 32.6 pg (ref 26.0–34.0)
MCHC: 33.2 g/dL (ref 30.0–36.0)
MCV: 98.3 fL (ref 80.0–100.0)
Monocytes Absolute: 0.4 10*3/uL (ref 0.1–1.0)
Monocytes Relative: 14 %
Neutro Abs: 1.9 10*3/uL (ref 1.7–7.7)
Neutrophils Relative %: 59 %
Platelets: 244 10*3/uL (ref 150–400)
RBC: 2.91 MIL/uL — ABNORMAL LOW (ref 3.87–5.11)
RDW: 18.6 % — ABNORMAL HIGH (ref 11.5–15.5)
WBC: 3.2 10*3/uL — ABNORMAL LOW (ref 4.0–10.5)
nRBC: 0 % (ref 0.0–0.2)

## 2021-11-28 LAB — IRON AND TIBC
Iron: 40 ug/dL (ref 28–170)
Saturation Ratios: 35 % — ABNORMAL HIGH (ref 10.4–31.8)
TIBC: 115 ug/dL — ABNORMAL LOW (ref 250–450)
UIBC: 75 ug/dL

## 2021-11-28 LAB — RETICULOCYTES
Immature Retic Fract: 12.7 % (ref 2.3–15.9)
RBC.: 2.98 MIL/uL — ABNORMAL LOW (ref 3.87–5.11)
Retic Count, Absolute: 82.2 10*3/uL (ref 19.0–186.0)
Retic Ct Pct: 2.8 % (ref 0.4–3.1)

## 2021-11-28 LAB — COMPREHENSIVE METABOLIC PANEL
ALT: 23 U/L (ref 0–44)
AST: 24 U/L (ref 15–41)
Albumin: 2.3 g/dL — ABNORMAL LOW (ref 3.5–5.0)
Alkaline Phosphatase: 156 U/L — ABNORMAL HIGH (ref 38–126)
Anion gap: 8 (ref 5–15)
BUN: 67 mg/dL — ABNORMAL HIGH (ref 8–23)
CO2: 26 mmol/L (ref 22–32)
Calcium: 8.8 mg/dL — ABNORMAL LOW (ref 8.9–10.3)
Chloride: 100 mmol/L (ref 98–111)
Creatinine, Ser: 3.99 mg/dL — ABNORMAL HIGH (ref 0.44–1.00)
GFR, Estimated: 12 mL/min — ABNORMAL LOW (ref >60)
Glucose, Bld: 99 mg/dL (ref 70–99)
Potassium: 3.7 mmol/L (ref 3.5–5.1)
Sodium: 134 mmol/L — ABNORMAL LOW (ref 135–145)
Total Bilirubin: 0.8 mg/dL (ref 0.3–1.2)
Total Protein: 5.1 g/dL — ABNORMAL LOW (ref 6.5–8.1)

## 2021-11-28 LAB — PROTIME-INR
INR: 1.1 (ref 0.8–1.2)
Prothrombin Time: 13.7 seconds (ref 11.4–15.2)

## 2021-11-28 LAB — TYPE AND SCREEN
ABO/RH(D): A POS
Antibody Screen: NEGATIVE

## 2021-11-28 LAB — FERRITIN: Ferritin: 1112 ng/mL — ABNORMAL HIGH (ref 11–307)

## 2021-11-28 LAB — FOLATE: Folate: 82 ng/mL (ref >5.9)

## 2021-11-28 MED ORDER — APIXABAN 5 MG PO TABS: 5.0000 mg | ORAL_TABLET | Freq: Two times a day (BID) | ORAL | Status: DC

## 2021-11-28 MED ORDER — SEVELAMER CARBONATE 800 MG PO TABS
800.0000 mg | ORAL_TABLET | Freq: Three times a day (TID) | ORAL | Status: DC
  Administered 2021-11-29: 08:00:00 800 mg via ORAL
  Filled 2021-11-28 (×2): qty 1

## 2021-11-28 MED ORDER — APIXABAN 5 MG PO TABS
10.0000 mg | ORAL_TABLET | Freq: Two times a day (BID) | ORAL | Status: DC
  Administered 2021-11-28 – 2021-12-01 (×6): 10 mg via ORAL
  Filled 2021-11-28 (×6): qty 2

## 2021-11-28 MED ORDER — ACETAMINOPHEN 650 MG RE SUPP: 650.0000 mg | Freq: Four times a day (QID) | RECTAL | Status: DC | PRN

## 2021-11-28 MED ORDER — HYDRALAZINE HCL 20 MG/ML IJ SOLN: 5.0000 mg | INTRAMUSCULAR | Status: DC | PRN

## 2021-11-28 MED ORDER — SODIUM CHLORIDE 0.9 % IV SOLN: INTRAVENOUS | Status: AC

## 2021-11-28 MED ORDER — SODIUM BICARBONATE 650 MG PO TABS
650.0000 mg | ORAL_TABLET | Freq: Two times a day (BID) | ORAL | Status: DC
  Administered 2021-11-29 – 2021-12-01 (×5): 650 mg via ORAL
  Filled 2021-11-28 (×9): qty 1

## 2021-11-28 MED ORDER — PANTOPRAZOLE SODIUM 40 MG IV SOLR
40.0000 mg | Freq: Two times a day (BID) | INTRAVENOUS | Status: DC
  Administered 2021-11-28 – 2021-12-01 (×6): 40 mg via INTRAVENOUS
  Filled 2021-11-28 (×6): qty 40

## 2021-11-28 MED ORDER — AMLODIPINE BESYLATE 10 MG PO TABS
10.0000 mg | ORAL_TABLET | Freq: Every day | ORAL | Status: DC
  Administered 2021-11-29 – 2021-12-01 (×3): 10 mg via ORAL
  Filled 2021-11-28 (×3): qty 1

## 2021-11-28 MED ORDER — ACETAMINOPHEN 325 MG PO TABS: 650.0000 mg | ORAL_TABLET | Freq: Four times a day (QID) | ORAL | Status: DC | PRN

## 2021-11-28 MED ORDER — SODIUM CHLORIDE 0.9% FLUSH
3.0000 mL | Freq: Two times a day (BID) | INTRAVENOUS | Status: DC
  Administered 2021-11-28 – 2021-12-01 (×6): 3 mL via INTRAVENOUS

## 2021-11-28 NOTE — ED Provider Notes (Signed)
[facility]ERG[facility]Y DEPARTMENT Provider Note   CSN: [id number] Arrival date & time: 11/28/21  1234     History Chief Complaint  Patient presents with   Foot Swelling    Brianna Hamilton is a 65 y.o. female with history of chronic kidney disease, liver cancer, mental handicap presents with sister for evaluation of left ankle pain and lower extremity swelling.  Patient has been living at home alone, sister states patient has had numerous falls over the last week.  5 days ago she did have a fall and injury to the head with bruising to the face.  Patient complains of left ankle pain and leg swelling.  She consulted oncologist who recommended going to the ER for evaluation of blood clot.  No history of blood clots.  She denies any chest pain, shortness of breath.  No current headache nausea vomiting or vision changes.  Patient is a difficult historian, hard of hearing.    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   Duodenal ulcer 12/19/2016   Acute blood loss anemia 12/19/2016   GIB (gastrointestinal bleeding) 12/18/2016   Moderate mitral insufficiency 03/08/2015   Chest pain 02/28/2015   Moderate tricuspid insufficiency 02/22/2015   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   Primary chronic pseudo-obstruction of stomach 11/03/2014   Anemia 05/31/2014   Hyperparathyroidism , secondary, non-renal ([place]) 05/31/2014   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/31/2014   Hearing loss 08/15/2011   Chronic kidney disease, stage IV (severe)  ([place]) 08/14/2011   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   Low serum vitamin D 05/11/2008   Diverticulosis 03/01/2008    Past Surgical History:  Procedure Laterality Date   ABDOMINAL HYSTERECTOMY     COLONOSCOPY WITH PROPOFOL N/A 02/04/2018   Procedure: COLONOSCOPY WITH PROPOFOL;  Surgeon: Lin Landsman, MD;  Location: [facility];  Service: Gastroenterology;  Laterality: N/A;   ESOPHAGOGASTRODUODENOSCOPY (EGD) WITH PROPOFOL N/A 12/18/2016   Procedure: ESOPHAGOGASTRODUODENOSCOPY (EGD) WITH PROPOFOL;  Surgeon: Jonathon Bellows, MD;  Location: [facility] ENDOS[facility];  Service: Endoscopy;  Laterality: N/A;   ESOPHAGOGASTRODUODENOSCOPY (EGD) WITH PROPOFOL N/A 02/28/2017   Procedure: ESOPHAGOGASTRODUODENOSCOPY (EGD) WITH PROPOFOL;  Surgeon: Jonathon Bellows, MD;  Location: [facility] ENDOSCOPY;  Service: Endoscopy;  Laterality: N/A;     OB History   No obstetric history on file.     Family History  Problem Relation Age of Onset   Breast cancer Neg Hx     Social History   Tobacco Use   Smoking status: Never   Smokeless tobacco: Never  Substance Use Topics   Alcohol use: No   Drug use: No    Home Medications Prior to Admission medications   Medication Sig Start Date End Date Taking? Authorizing Provider  acetaminophen (TYLENOL) 325 MG tablet Take 325 mg by mouth.    [provider]  AFLURIA QUADRIVALENT 0.5 ML injection TO BE ADMINISTERED BY PHARMACIST FOR IMMUNIZATION 08/09/17   [provider]  amLODipine (NORVASC) 5 MG tablet Take 10 mg by mouth  daily.  12/12/16   [provider]  ethyl chloride spray Apply topically. 08/20/16   [provider]  furosemide (LASIX) 40 MG tablet TAKE 1 TABLET (40 MG TOTAL) BY MOUTH TWO (2) TIMES A DAY. 09/01/17   [provider]  pantoprazole (PROTONIX) 40 MG tablet TAKE 1 TABLET (40 MG TOTAL) BY MOUTH DAILY BEFORE BREAKFAST. 04/11/18   Lin Landsman, MD  sevelamer carbonate (RENVELA) 800 MG tablet Take by mouth.     [provider]  valsartan (DIOVAN) 160 MG tablet Take 160 mg by mouth daily. 02/04/17   [provider]  vitamin B-12 (CYANOCOBALAMIN) 1000 MCG tablet Take 1,000 mcg by mouth daily.    [provider]    Allergies    Sulfa antibiotics and Penicillins  Review of Systems   Review of Systems  Constitutional:  Negative for chills and fever.  HENT:  Negative for congestion, facial swelling, hearing loss and rhinorrhea.   Respiratory:  Negative for cough, shortness of breath and stridor.   Cardiovascular:  Negative for chest pain.  Gastrointestinal:  Negative for abdominal pain, nausea and vomiting.  Genitourinary:  Negative for dysuria and flank pain.  Musculoskeletal:  Positive for joint swelling and myalgias. Negative for arthralgias, back pain, neck pain and neck stiffness.  Skin:  Negative for rash and wound.  Neurological:  Negative for dizziness, light-headedness and headaches.  Psychiatric/Behavioral:  Negative for confusion.    Physical Exam Updated Vital Signs BP 100/71 (BP Location: Left Arm)    Pulse (!) 101    Temp 97.9 F (36.6 C) (Oral)    Resp 16    Ht 5\' 5"  (1.651 m)    Wt 60.8 kg    SpO2 96%    BMI 22.31 kg/m   Physical Exam Constitutional:      Appearance: Normal appearance. She is well-developed.  HENT:     Head: Normocephalic.     Comments: Right forehead ecchymosis. No hematoma.    Right Ear: Ear canal normal.     Left Ear: Ear canal normal.     Nose: Nose normal.     Mouth/Throat:     Mouth: Mucous membranes are dry.  Eyes:     Conjunctiva/sclera: Conjunctivae normal.  Cardiovascular:     Rate and Rhythm: Normal rate.  Pulmonary:     Effort: Pulmonary effort is normal. No respiratory distress.  Musculoskeletal:        General: Swelling, tenderness and deformity present. Normal range of motion.     Cervical back: Normal range of motion.     Comments: Bilateral lower extremity swelling in the foot and ankles, left ankle tender to  palpation with no warmth or redness.  No skin breakdown noted.  Normal range of motion of both hips and knees with no pain.  No tenderness palpation in both hips and knees.  Skin:    General: Skin is warm.     Capillary Refill: Capillary refill takes less than 2 seconds.     Findings: No rash.  Neurological:     General: No focal deficit present.     Mental Status: She is alert and oriented to person, place, and time.     Cranial Nerves: No cranial nerve deficit.     Motor: No weakness.     Gait: Gait normal.  Psychiatric:        Mood and Affect: Mood normal.        Behavior: Behavior normal.  Thought Content: Thought content normal.    ED Results / Procedures / Treatments   Labs (all labs ordered are listed, but only abnormal results are displayed) Labs Reviewed  COMPREHENSIVE METABOLIC PANEL - Abnormal; Notable for the following components:      Result Value   Sodium 134 (*)    BUN 67 (*)    Creatinine, Ser 3.99 (*)    Calcium 8.8 (*)    Total Protein 5.1 (*)    Albumin 2.3 (*)    Alkaline Phosphatase 156 (*)    GFR, Estimated 12 (*)    All other components within normal limits  CBC WITH DIFFERENTIAL/PLATELET - Abnormal; Notable for the following components:   WBC 3.2 (*)    RBC 2.91 (*)    Hemoglobin 9.5 (*)    HCT 28.6 (*)    RDW 18.6 (*)    All other components within normal limits  PROTIME-INR    EKG None  Radiology DG Tibia/Fibula Left  Result Date: 11/28/2021 CLINICAL DATA:  LEFT leg pain after fall, rule out fracture. EXAM: LEFT TIBIA AND FIBULA - 2 VIEW COMPARISON:  None. FINDINGS: There is no evidence of fracture or other focal bone lesions. Soft tissues are unremarkable. IMPRESSION: Negative. Electronically Signed   By: Franki Cabot M.D.   On: 11/28/2021 17:31   US Venous Img Lower Bilateral  Result Date: 11/28/2021 CLINICAL DATA:  Bilateral lower extremity edema on chemotherapy EXAM: BILATERAL LOWER EXTREMITY VENOUS DOPPLER ULTRASOUND  TECHNIQUE: Gray-scale sonography with graded compression, as well as color Doppler and duplex ultrasound were performed to evaluate the lower extremity deep venous systems from the level of the common femoral vein and including the common femoral, femoral, profunda femoral, popliteal and calf veins including the posterior tibial, peroneal and gastrocnemius veins when visible. The superficial great saphenous vein was also interrogated. Spectral Doppler was utilized to evaluate flow at rest and with distal augmentation maneuvers in the common femoral, femoral and popliteal veins. COMPARISON:  None. FINDINGS: RIGHT LOWER EXTREMITY Common Femoral Vein: No evidence of thrombus. Normal compressibility, respiratory phasicity and response to augmentation. Saphenofemoral Junction: No evidence of thrombus. Normal compressibility and flow on color Doppler imaging. Profunda Femoral Vein: No evidence of thrombus. Normal compressibility and flow on color Doppler imaging. Femoral Vein: No evidence of thrombus. Normal compressibility, respiratory phasicity and response to augmentation. Popliteal Vein: No evidence of thrombus. Normal compressibility, respiratory phasicity and response to augmentation. Calf Veins: No evidence of thrombus. Normal compressibility and flow on color Doppler imaging. LEFT LOWER EXTREMITY Common Femoral Vein: No evidence of thrombus. Normal compressibility, respiratory phasicity and response to augmentation. Saphenofemoral Junction: No evidence of thrombus. Normal compressibility and flow on color Doppler imaging. Profunda Femoral Vein: No evidence of thrombus. Normal compressibility and flow on color Doppler imaging. Femoral Vein: No evidence of thrombus. Normal compressibility, respiratory phasicity and response to augmentation. Popliteal Vein: No evidence of thrombus. Normal compressibility, respiratory phasicity and response to augmentation. Calf Veins: Expansile appearance of the calf veins with  occlusive DVT. Other Findings:  None. IMPRESSION: 1. There is acute appearing DVT involving the left calf veins. The left popliteal and femoral veins are patent without involvement. 2. No findings of right lower extremity DVT. These results were called by telephone at the time of interpretation on 11/28/2021 at 3:32 pm to provider CARI TRIPLETT , who verbally acknowledged these results. Electronically Signed   By: Albin Felling M.D.   On: 11/28/2021 15:32    Procedures Procedures  Medications Ordered in ED Medications - No data to display  ED Course  I have reviewed the triage vital signs and the nursing notes.  Pertinent labs & imaging results that were available during my care of the patient were reviewed by me and considered in my medical decision making (see chart for details).    MDM Rules/Calculators/A&P                          65 year old female with acute DVT left lower extremity.  Patient with history of chronic kidney disease was on hemodialysis several years ago but stopped treatment.  She is currently undergoing treatment for bladder cancer.  She lives at home alone, has had numerous falls over the last week, CT of the head was negative.  We will start patient on Eliquis.  Will discuss admission with hospitalist for physical therapy and possible placement due to continued falling.   Final Clinical Impression(s) / ED Diagnoses Final diagnoses:  DVT, lower extremity, distal, acute, left ([place])  Multiple falls  Chronic renal failure, stage 5 [facility])    Rx / DC Orders ED Discharge Orders     None        Renata Caprice 11/28/21 1919    Delman Kitten, MD 11/28/21 2143

## 2021-11-28 NOTE — ED Provider Notes (Signed)
Medical screening examination/treatment/procedure(s) were conducted as a shared visit with non-physician practitioner(s) and myself.  I personally evaluated the patient during the encounter.    Delman Kitten, MD 11/28/21 [phone]

## 2021-11-28 NOTE — ED Provider Notes (Signed)
Emergency Medicine Provider Triage Evaluation Note  Brianna Hamilton , a 65 y.o. female  was evaluated in triage.  Pt complains of bilateral foot swelling that has been worsening over the past 5 days. Currently on chemo for liver cancer.  Review of Systems  Positive: Bilateral foot swelling. Negative: Fever  Physical Exam  BP 100/71 (BP Location: Left Arm)    Pulse (!) 101    Temp 97.9 F (36.6 C) (Oral)    Resp 16    Ht 5\' 5"  (1.651 m)    Wt 60.8 kg    SpO2 96%    BMI 22.31 kg/m  Gen:   Awake, no distress   Resp:  Normal effort  MSK:   Moves extremities without difficulty  Other:    Medical Decision Making  Medically screening exam initiated at 1:56 PM.  Appropriate orders placed.  Brianna Hamilton was informed that the remainder of the evaluation will be completed by another provider, this initial triage assessment does not replace that evaluation, and the importance of remaining in the ED until their evaluation is complete.    Victorino Dike, FNP 11/28/21 1402    Merlyn Lot, MD 11/28/21 [phone]

## 2021-11-28 NOTE — ED Triage Notes (Signed)
Pt here with bilateral feet swelling, left worse since last week. Pt is currently on chemo for liver CA. Pt is NAD in triage.

## 2021-11-28 NOTE — H&P (Addendum)
History and Physical    Brianna Hamilton QVZ:[id number] DOB: 1956/07/22 DOA: 11/28/2021  PCP: Marinda Elk, MD    Patient coming from:  Home    Chief Complaint:  Falls / leg swelling   HPI:  Brianna Hamilton is a 65 y.o. female seen in ed with complaints of Falls and left leg swelling more than right.  Patient is said to be living home alone for several years and doing well by herself with close supervision from her sister.  Since starting her chemotherapy patient has had falls and has been having leg swelling and has been little weak generally in just not doing well according to the sister.  They called her primary oncologist who recommended to come to the emergency room for evaluation of blood clots because of the leg swelling.  Patient is hard of hearing and is agitated and denies any complaints states she is not hurting anywhere but allows me to examine her briefly.Sister at bedside.  Sitter reports that patient was a dialysis patient in the past and has been off of dialysis for about 3 years.   Pt has past medical history of allergies to sulfa and penicillin, stomach ulcer, cancer, dialysis, GERD, hypertension, rectal prolapse, solitary kidney congenital, anemia, hyperparathyroidism, diverticulosis.  ED Course:  Vitals:   11/28/21 1355 11/28/21 1831 11/28/21 2211 11/28/21 2249  BP:  127/71 131/78 (!) 144/72  Pulse:  70 65 (!) 107  Resp:  15 19 16   Temp:   97.9 F (36.6 C) 97.9 F (36.6 C)  TempSrc:   Oral Oral  SpO2:  99% 98% 100%  Weight: 60.8 kg     Height: 5\' 5"  (1.651 m)      In the emergency room patient is alert awake oriented somewhat cooperative does not want to be bothered but allows me to examine her answers questions briefly and sister gives history mostly.  Vitals shows patient is afebrile heart rate of 101 initially oxygenating 96% on room air. Blood work shows a sodium of 134 BUN of 67 creatinine of 3.99  Lab Results  Component Value Date    CREATININE 3.99 (H) 11/28/2021   CREATININE 4.11 (H) 04/24/2018   CREATININE 2.54 (H) 08/28/2017  CBC shows a white count of 3.2 hemoglobin of 9.5 and RDW of 18.6. Bilateral lower extremity venous Doppler done in the emergency room shows there is an acute appearing DVT in the left calf veins left popliteal and left femoral veins are patent no findings of right lower extremity DVT.  Per ED provider Case was discussed with nephrologist who recommended Eliquis.  Review of Systems:  Review of Systems  Unable to perform ROS: Mental acuity    Past Medical History:  Diagnosis Date   Bleeding stomach ulcer    Cancer ([place])    basal cell   Dialysis patient ([place])    GERD (gastroesophageal reflux disease)    Hypertension    Rectal prolapse    Renal disorder     Past Surgical History:  Procedure Laterality Date   ABDOMINAL HYSTERECTOMY     COLONOSCOPY WITH PROPOFOL N/A 02/04/2018   Procedure: COLONOSCOPY WITH PROPOFOL;  Surgeon: Lin Landsman, MD;  Location: [facility] ENDOSCOPY;  Service: Gastroenterology;  Laterality: N/A;   ESOPHAGOGASTRODUODENOSCOPY (EGD) WITH PROPOFOL N/A 12/18/2016   Procedure: ESOPHAGOGASTRODUODENOSCOPY (EGD) WITH PROPOFOL;  Surgeon: Jonathon Bellows, MD;  Location: [facility] ENDOSCOPY;  Service: Endoscopy;  Laterality: N/A;   ESOPHAGOGASTRODUODENOSCOPY (EGD) WITH PROPOFOL N/A 02/28/2017   Procedure: ESOPHAGOGASTRODUODENOSCOPY (EGD) WITH  PROPOFOL;  Surgeon: Jonathon Bellows, MD;  Location: [facility] ENDOSCOPY;  Service: Endoscopy;  Laterality: N/A;     reports that she has never smoked. She has never used smokeless tobacco. She reports that she does not drink alcohol and does not use drugs.  Allergies  Allergen Reactions   Sulfa Antibiotics Other (See Comments)   Penicillins Rash    Family History  Problem Relation Age of Onset   Breast cancer Neg Hx     Prior to Admission medications   Medication Sig Start Date End Date Taking? Authorizing Provider  acetaminophen (TYLENOL) 325 MG  tablet Take 325 mg by mouth.    [provider]  AFLURIA QUADRIVALENT 0.5 ML injection TO BE ADMINISTERED BY PHARMACIST FOR IMMUNIZATION 08/09/17   [provider]  amLODipine (NORVASC) 5 MG tablet Take 10 mg by mouth daily.  12/12/16   [provider]  ethyl chloride spray Apply topically. 08/20/16   [provider]  furosemide (LASIX) 40 MG tablet TAKE 1 TABLET (40 MG TOTAL) BY MOUTH TWO (2) TIMES A DAY. 09/01/17   [provider]  pantoprazole (PROTONIX) 40 MG tablet TAKE 1 TABLET (40 MG TOTAL) BY MOUTH DAILY BEFORE BREAKFAST. 04/11/18   Lin Landsman, MD  sevelamer carbonate (RENVELA) 800 MG tablet Take by mouth.    [provider]  valsartan (DIOVAN) 160 MG tablet Take 160 mg by mouth daily. 02/04/17   [provider]  vitamin B-12 (CYANOCOBALAMIN) 1000 MCG tablet Take 1,000 mcg by mouth daily.    [provider]    Physical Exam: Vitals:   11/28/21 1355 11/28/21 1831 11/28/21 2211 11/28/21 2249  BP:  127/71 131/78 (!) 144/72  Pulse:  70 65 (!) 107  Resp:  15 19 16   Temp:   97.9 F (36.6 C) 97.9 F (36.6 C)  TempSrc:   Oral Oral  SpO2:  99% 98% 100%  Weight: 60.8 kg     Height: 5\' 5"  (1.651 m)      Physical Exam Vitals and nursing note reviewed.  Constitutional:      Appearance: She is not ill-appearing.  HENT:     Head: Normocephalic and atraumatic.     Right Ear: External ear normal.     Left Ear: External ear normal.     Mouth/Throat:     Mouth: Mucous membranes are moist.  Eyes:     Extraocular Movements: Extraocular movements intact.     Pupils: Pupils are equal, round, and reactive to light.  Neck:     Vascular: No carotid bruit.  Cardiovascular:     Rate and Rhythm: Normal rate and regular rhythm.     Pulses: Normal pulses.     Heart sounds: Normal heart sounds.  Pulmonary:     Effort: Pulmonary effort is normal.     Breath sounds: Normal breath sounds.  Abdominal:     General: Bowel  sounds are normal. There is no distension.     Palpations: Abdomen is soft. There is no mass.     Tenderness: There is no abdominal tenderness. There is no guarding.     Hernia: No hernia is present.  Musculoskeletal:     Right lower leg: Edema present.     Left lower leg: Edema present.  Skin:    General: Skin is warm.  Neurological:     General: No focal deficit present.     Mental Status: She is alert.     Cranial Nerves: Cranial nerves 2-12 are  intact.     Motor: Motor function is intact.  Psychiatric:        Attention and Perception: She is inattentive.        Mood and Affect: Mood is anxious. Affect is angry.        Behavior: Behavior is agitated. Behavior is cooperative.   Labs on Admission: I have personally reviewed following labs and imaging studies  No results for input(s): CKTOTAL, CKMB, TROPONINI in the last 72 hours. Lab Results  Component Value Date   WBC 3.2 (L) 11/28/2021   HGB 9.5 (L) 11/28/2021   HCT 28.6 (L) 11/28/2021   MCV 98.3 11/28/2021   PLT 244 11/28/2021    Recent Labs  Lab 11/28/21 1356  NA 134*  K 3.7  CL 100  CO2 26  BUN 67*  CREATININE 3.99*  CALCIUM 8.8*  PROT 5.1*  BILITOT 0.8  ALKPHOS 156*  ALT 23  AST 24  GLUCOSE 99   No results found for: CHOL, HDL, LDLCALC, TRIG No results found for: DDIMER Invalid input(s): POCBNP   COVID-19 Labs Recent Labs    11/28/21 1957  FERRITIN 1,112*   No results found for: [place] on Admission: DG Tibia/Fibula Left  Result Date: 11/28/2021 CLINICAL DATA:  LEFT leg pain after fall, rule out fracture. EXAM: LEFT TIBIA AND FIBULA - 2 VIEW COMPARISON:  None. FINDINGS: There is no evidence of fracture or other focal bone lesions. Soft tissues are unremarkable. IMPRESSION: Negative. Electronically Signed   By: Franki Cabot M.D.   On: 11/28/2021 17:31   CT Head Wo Contrast  Result Date: 11/28/2021 CLINICAL DATA:  Head trauma, minor EXAM: CT HEAD WITHOUT CONTRAST  TECHNIQUE: Contiguous axial images were obtained from the base of the skull through the vertex without intravenous contrast. COMPARISON:  None. FINDINGS: Brain: Mild diffuse cerebral atrophy. No ventricular dilatation. No mass effect or midline shift. No abnormal extra-axial fluid collections. Gray-white matter junctions are distinct. Basal cisterns are not effaced. No acute intracranial hemorrhage. Vascular: Mild intracranial arterial vascular calcifications. Skull: Calvarium appears intact.  No destructive bone lesions. Sinuses/Orbits: Paranasal sinuses are clear. Opacification of mastoid air cells bilaterally. Other: None. IMPRESSION: 1. No acute intracranial abnormalities.  Mild cerebral atrophy. 2. Bilateral mastoid effusions. Electronically Signed   By: Lucienne Capers M.D.   On: 11/28/2021 18:30   US Venous Img Lower Bilateral  Result Date: 11/28/2021 CLINICAL DATA:  Bilateral lower extremity edema on chemotherapy EXAM: BILATERAL LOWER EXTREMITY VENOUS DOPPLER ULTRASOUND TECHNIQUE: Gray-scale sonography with graded compression, as well as color Doppler and duplex ultrasound were performed to evaluate the lower extremity deep venous systems from the level of the common femoral vein and including the common femoral, femoral, profunda femoral, popliteal and calf veins including the posterior tibial, peroneal and gastrocnemius veins when visible. The superficial great saphenous vein was also interrogated. Spectral Doppler was utilized to evaluate flow at rest and with distal augmentation maneuvers in the common femoral, femoral and popliteal veins. COMPARISON:  None. FINDINGS: RIGHT LOWER EXTREMITY Common Femoral Vein: No evidence of thrombus. Normal compressibility, respiratory phasicity and response to augmentation. Saphenofemoral Junction: No evidence of thrombus. Normal compressibility and flow on color Doppler imaging. Profunda Femoral Vein: No evidence of thrombus. Normal compressibility and flow on  color Doppler imaging. Femoral Vein: No evidence of thrombus. Normal compressibility, respiratory phasicity and response to augmentation. Popliteal Vein: No evidence of thrombus. Normal compressibility, respiratory phasicity and response to augmentation. Calf Veins: No evidence of thrombus. Normal  compressibility and flow on color Doppler imaging. LEFT LOWER EXTREMITY Common Femoral Vein: No evidence of thrombus. Normal compressibility, respiratory phasicity and response to augmentation. Saphenofemoral Junction: No evidence of thrombus. Normal compressibility and flow on color Doppler imaging. Profunda Femoral Vein: No evidence of thrombus. Normal compressibility and flow on color Doppler imaging. Femoral Vein: No evidence of thrombus. Normal compressibility, respiratory phasicity and response to augmentation. Popliteal Vein: No evidence of thrombus. Normal compressibility, respiratory phasicity and response to augmentation. Calf Veins: Expansile appearance of the calf veins with occlusive DVT. Other Findings:  None. IMPRESSION: 1. There is acute appearing DVT involving the left calf veins. The left popliteal and femoral veins are patent without involvement. 2. No findings of right lower extremity DVT. These results were called by telephone at the time of interpretation on 11/28/2021 at 3:32 pm to provider Brianna Hamilton , who verbally acknowledged these results. Electronically Signed   By: Albin Felling M.D.   On: 11/28/2021 15:32    EKG: Independently reviewed.  None     Assessment/Plan: Principal Problem:   Falls Active Problems:   Leg swelling   DVT (deep venous thrombosis) (HCC)   Hypertension   Anemia   Duodenal ulcer   Chronic kidney disease, stage IV (severe) (HCC)   Hearing loss   Mild intellectual disability   Falls: PT consult when appropriate, fall precaution.  Leg swelling/DVT: Patient found to have left leg DVT and started on Eliquis. Bedrest as patient is having leg  pain.  Hypertension:  Blood pressure (!) 144/72, pulse (!) 107, temperature 97.9 F (36.6 C), temperature source Oral, resp. rate 16, height 5\' 5"  (1.651 m), weight 60.8 kg, SpO2 100 %. We will continue patient on home regimen of amlodipine, valsartan held Lasix held due to creatinine.  Monitor and resume when appropriate.  Anemia/duodenal ulcer/CKD: Attribute to anemia of chronic kidney disease. Patient also has a history of duodenal ulcer we will start patient on IV PPI therapy.  We will follow CBC daily.  CKD: Patient's creatinine currently at baseline. Lab Results  Component Value Date   CREATININE 3.99 (H) 11/28/2021   CREATININE 4.11 (H) 04/24/2018   CREATININE 2.54 (H) 08/28/2017  We will avoid any contrast studies and renally dose all needed medications. Patient's ARB therapy held, will restart at a lower dose in a.m. after creatinine recheck. Nephrology has been consulted Dr. Juleen China to follow-up with patient tomorrow.  Nephrology-Dr. Juleen China  Hearing loss: Patient is hearing impaired.  Mild intellectual disability: Stable patient at baseline per sister. Is able to manage her ADLs and herself at home and all her resources are within reach, including grocery stores, pharmacy.    DVT prophylaxis:  scd's   Code Status:  Full code    Family Communication:  Brianna Hamilton (Sister)  [phone] (Work Phone)   Disposition Plan:  TBD   Consults called:  Nephrology- Dr. Juleen China.   Admission status: Inpatient.     Para Skeans MD [facility] [phone] How to contact the [facility] Attending or Consulting provider [place] or covering provider during after hours [place], for this patient.    Check the care team in [facility] and look for a) attending/consulting [facility] provider listed and b) the [facility] team listed Log into [email or web address] and use [place]'s universal password to access. If you do not have the password, please contact the hospital operator. Locate the  [facility] provider you are looking for under [facility] and page to a number that you can  be directly reached. If you still have difficulty reaching the provider, please page the [facility] (Director on Call) for the Hospitalists listed on amion for assistance. [email or web address] Password [email or web address] 11/29/2021, 1:42 AM

## 2021-11-29 ENCOUNTER — Encounter: Payer: Self-pay | Admitting: Internal Medicine

## 2021-11-29 DIAGNOSIS — I82402 Acute embolism and thrombosis of unspecified deep veins of left lower extremity: Secondary | ICD-10-CM | POA: Diagnosis present

## 2021-11-29 DIAGNOSIS — D638 Anemia in other chronic diseases classified elsewhere: Secondary | ICD-10-CM

## 2021-11-29 DIAGNOSIS — N184 Chronic kidney disease, stage 4 (severe): Secondary | ICD-10-CM | POA: Diagnosis not present

## 2021-11-29 DIAGNOSIS — I824Y2 Acute embolism and thrombosis of unspecified deep veins of left proximal lower extremity: Secondary | ICD-10-CM | POA: Diagnosis not present

## 2021-11-29 LAB — HIV ANTIBODY (ROUTINE TESTING W REFLEX): HIV Screen 4th Generation wRfx: NONREACTIVE

## 2021-11-29 LAB — CBC
HCT: 23.2 % — ABNORMAL LOW (ref 36.0–46.0)
Hemoglobin: 7.7 g/dL — ABNORMAL LOW (ref 12.0–15.0)
MCH: 31.8 pg (ref 26.0–34.0)
MCHC: 33.2 g/dL (ref 30.0–36.0)
MCV: 95.9 fL (ref 80.0–100.0)
Platelets: 149 10*3/uL — ABNORMAL LOW (ref 150–400)
RBC: 2.42 MIL/uL — ABNORMAL LOW (ref 3.87–5.11)
RDW: 18.2 % — ABNORMAL HIGH (ref 11.5–15.5)
WBC: 2.4 10*3/uL — ABNORMAL LOW (ref 4.0–10.5)
nRBC: 0 % (ref 0.0–0.2)

## 2021-11-29 LAB — COMPREHENSIVE METABOLIC PANEL
ALT: 19 U/L (ref 0–44)
AST: 18 U/L (ref 15–41)
Albumin: 1.9 g/dL — ABNORMAL LOW (ref 3.5–5.0)
Alkaline Phosphatase: 135 U/L — ABNORMAL HIGH (ref 38–126)
Anion gap: 7 (ref 5–15)
BUN: 68 mg/dL — ABNORMAL HIGH (ref 8–23)
CO2: 24 mmol/L (ref 22–32)
Calcium: 8.4 mg/dL — ABNORMAL LOW (ref 8.9–10.3)
Chloride: 105 mmol/L (ref 98–111)
Creatinine, Ser: 4.04 mg/dL — ABNORMAL HIGH (ref 0.44–1.00)
GFR, Estimated: 12 mL/min — ABNORMAL LOW (ref >60)
Glucose, Bld: 78 mg/dL (ref 70–99)
Potassium: 3.4 mmol/L — ABNORMAL LOW (ref 3.5–5.1)
Sodium: 136 mmol/L (ref 135–145)
Total Bilirubin: 0.7 mg/dL (ref 0.3–1.2)
Total Protein: 4.2 g/dL — ABNORMAL LOW (ref 6.5–8.1)

## 2021-11-29 LAB — VITAMIN B12: Vitamin B-12: 2049 pg/mL — ABNORMAL HIGH (ref 180–914)

## 2021-11-29 MED ORDER — SEVELAMER CARBONATE 0.8 G PO PACK
0.8000 g | PACK | Freq: Three times a day (TID) | ORAL | Status: DC
  Administered 2021-11-29 – 2021-12-01 (×4): 0.8 g via ORAL
  Filled 2021-11-29 (×9): qty 1

## 2021-11-29 NOTE — Evaluation (Signed)
Occupational Therapy Evaluation Patient Details Name: Brianna Hamilton MRN: [id number] DOB: Aug 25, 1956 Today's Date: 11/29/2021   History of Present Illness Brianna Hamilton is a 65 y.o. female with history of chronic kidney disease, liver cancer, mental handicap presents for evaluation of left ankle pain and lower extremity swelling.   Clinical Impression   [name] was seen for OT evaluation this date. Prior to hospital admission, pt was MOD I for mobility and ADLs. Pt lives alone with sister frequently checking in, assists with all IADLs. Pt presents to acute OT demonstrating impaired ADL performance and functional mobility 2/2 decreased activity tolerance, functional strength/ROM/balance deficits. Upon arrival pt seated EOB with PT. Pt currently requires MIN A don/doff B socks seated EOC - assist for threading over toes. CGA + RW for BSC t/f and perihygiene in standing. Pt would benefit from skilled OT to address noted impairments and functional limitations (see below for any additional details) in order to maximize safety and independence while minimizing falls risk and caregiver burden. Upon hospital discharge, recommend HHOT to maximize pt safety and return to PLOF.       Recommendations for follow up therapy are one component of a multi-disciplinary discharge planning process, led by the attending physician.  Recommendations may be updated based on patient status, additional functional criteria and insurance authorization.   Follow Up Recommendations  Home health OT    Assistance Recommended at Discharge Intermittent Supervision/Assistance  Functional Status Assessment  Patient has had a recent decline in their functional status and demonstrates the ability to make significant improvements in function in a reasonable and predictable amount of time.  Equipment Recommendations  BSC/3in1    Recommendations for Other Services       Precautions / Restrictions  Precautions Precautions: Fall Restrictions Weight Bearing Restrictions: No      Mobility Bed Mobility               General bed mobility comments: received and left in sitting    Transfers Overall transfer level: Needs assistance Equipment used: Rolling walker (2 wheels) Transfers: Sit to/from Stand Sit to Stand: Min guard                  Balance Overall balance assessment: Needs assistance Sitting-balance support: No upper extremity supported;Feet supported Sitting balance-Leahy Scale: Good     Standing balance support: During functional activity;Single extremity supported Standing balance-Leahy Scale: Fair                             ADL either performed or assessed with clinical judgement   ADL Overall ADL's : Needs assistance/impaired                                       General ADL Comments: MIN A don/doff B socks seated EOC - assist for threading over toes. CGA + RW for BSC t/f and perihygiene in standing.      Pertinent Vitals/Pain Pain Assessment: No/denies pain     Hand Dominance Right   Extremity/Trunk Assessment Upper Extremity Assessment Upper Extremity Assessment: Generalized weakness   Lower Extremity Assessment Lower Extremity Assessment: Generalized weakness       Communication Communication Communication: HOH   Cognition Arousal/Alertness: Awake/alert Behavior During Therapy: Restless Overall Cognitive Status: History of cognitive impairments - at baseline  Home Living Family/patient expects to be discharged to:: Private residence Living Arrangements: Alone Available Help at Discharge: Family;Available PRN/intermittently Type of Home: House                       Home Equipment: Conservation officer, nature (2 wheels)          Prior Functioning/Environment Prior Level of Function : Independent/Modified Independent                ADLs Comments: Sister assists with IADLs        OT Problem List: Decreased strength;Decreased activity tolerance;Decreased range of motion;Impaired balance (sitting and/or standing);Decreased safety awareness      OT Treatment/Interventions: Self-care/ADL training;Therapeutic exercise;Energy conservation;DME and/or AE instruction;Therapeutic activities;Patient/family education;Balance training    OT Goals(Current goals can be found in the care plan section) Acute Rehab OT Goals Patient Stated Goal: to go home OT Goal Formulation: With patient/family Time For Goal Achievement: 12/13/21 Potential to Achieve Goals: Fair ADL Goals Pt Will Perform Grooming: standing;Independently Pt Will Perform Lower Body Dressing: sit to/from stand;Independently Pt Will Transfer to Toilet: with modified independence;ambulating;regular height toilet (c LRAD PRN)  OT Frequency: Min 2X/week    AM-PAC OT "6 Clicks" Daily Activity     Outcome Measure Help from another person eating meals?: None Help from another person taking care of personal grooming?: A Little Help from another person toileting, which includes using toliet, bedpan, or urinal?: A Little Help from another person bathing (including washing, rinsing, drying)?: A Little Help from another person to put on and taking off regular upper body clothing?: None Help from another person to put on and taking off regular lower body clothing?: A Little 6 Click Score: 20   End of Session Equipment Utilized During Treatment: Rolling walker (2 wheels)  Activity Tolerance: Patient tolerated treatment well Patient left: in chair;with call bell/phone within reach;with chair alarm set;with family/visitor present  OT Visit Diagnosis: Unsteadiness on feet (R26.81)                Time: 0786-7544 OT Time Calculation (min): 17 min Charges:  OT General Charges $OT Visit: 1 Visit OT Evaluation $OT Eval Low Complexity: 1 Low  Dessie Coma, M.S.  OTR/L  11/29/21, 12:48 PM  ascom [phone]

## 2021-11-29 NOTE — Progress Notes (Signed)
PROGRESS NOTE    Brianna Hamilton  VOH:[id number] DOB: [birth year] DOA: 11/28/2021 PCP: Marinda Elk, MD  Assessment & Plan:   Principal Problem:   Falls Active Problems:   Duodenal ulcer   Anemia   Chronic kidney disease, stage IV (severe) (HCC)   Hypertension   Hearing loss   Mild intellectual disability   DVT (deep venous thrombosis) (HCC)   Leg swelling   Falls: PT/OT consulted  DVT of LLE: continue on eliquis. D/c bed rest   HTN: continue on home dose of amlodipine  Hx of duodenal ulcer: continue on PPI  CKD: baseline Cr/GFR is unknown, currently stage IV. Cr is trending up from day prior. Nephro consulted   Likely ACD: H&H are labile. Will transfuse if Hb < 7.0  Mild intellectual disability: at baseline as per pt's sister. Continue w/ supportive care    DVT prophylaxis: eliquis  Code Status: full  Family Communication: discussed pt's care w/ pt's sister at bedside and answered her questions  Disposition Plan: depends on PT/OT   Level of care: Telemetry Medical  Status is: Inpatient  Remains inpatient appropriate because: severity of illness     Consultants:  Nephro   Procedures:   Antimicrobials:    Subjective: Pt c/o fatigue   Objective: Vitals:   11/28/21 1831 11/28/21 2211 11/28/21 2249 11/29/21 0454  BP: 127/71 131/78 (!) 144/72 125/75  Pulse: 70 65 (!) 107 61  Resp: 15 19 16 14   Temp:  97.9 F (36.6 C) 97.9 F (36.6 C) (!) 97.4 F (36.3 C)  TempSrc:  Oral Oral Oral  SpO2: 99% 98% 100% 100%  Weight:      Height:        Intake/Output Summary (Last 24 hours) at 11/29/2021 0754 Last data filed at 11/29/2021 0400 Gross per 24 hour  Intake 228.92 ml  Output --  Net 228.92 ml   Filed Weights   11/28/21 1355  Weight: 60.8 kg    Examination:  General exam: Appears calm but uncomfortable  Respiratory system: decreased breath sounds b/l  Cardiovascular system: S1 & S2 +. No  rubs, gallops or clicks. No pedal  edema. Gastrointestinal system: Abdomen is nondistended, soft and nontender. Normal bowel sounds heard. Central nervous system: Alert and awake. Moves all extremities Psychiatry: Judgement and insight appear poor. Flat mood and affect     Data Reviewed: I have personally reviewed following labs and imaging studies  CBC: Recent Labs  Lab 11/28/21 1356 11/29/21 0513  WBC 3.2* 2.4*  NEUTROABS 1.9  --   HGB 9.5* 7.7*  HCT 28.6* 23.2*  MCV 98.3 95.9  PLT 244 694*   Basic Metabolic Panel: Recent Labs  Lab 11/28/21 1356  NA 134*  K 3.7  CL 100  CO2 26  GLUCOSE 99  BUN 67*  CREATININE 3.99*  CALCIUM 8.8*   GFR: Estimated Creatinine Clearance: 12.3 mL/min (A) (by C-G formula based on SCr of 3.99 mg/dL (H)). Liver Function Tests: Recent Labs  Lab 11/28/21 1356  AST 24  ALT 23  ALKPHOS 156*  BILITOT 0.8  PROT 5.1*  ALBUMIN 2.3*   No results for input(s): LIPASE, AMYLASE in the last 168 hours. No results for input(s): AMMONIA in the last 168 hours. Coagulation Profile: Recent Labs  Lab 11/28/21 1957  INR 1.1   Cardiac Enzymes: No results for input(s): CKTOTAL, CKMB, CKMBINDEX, TROPONINI in the last 168 hours. BNP (last 3 results) No results for input(s): PROBNP in the last 8760 hours. HbA1C:  No results for input(s): HGBA1C in the last 72 hours. CBG: No results for input(s): GLUCAP in the last 168 hours. Lipid Profile: No results for input(s): CHOL, HDL, LDLCALC, TRIG, CHOLHDL, LDLDIRECT in the last 72 hours. Thyroid Function Tests: No results for input(s): TSH, T4TOTAL, FREET4, T3FREE, THYROIDAB in the last 72 hours. Anemia Panel: Recent Labs    11/28/21 1957  VITAMINB12 2,049*  FOLATE 82.0  FERRITIN 1,112*  TIBC 115*  IRON 40  RETICCTPCT 2.8   Sepsis Labs: No results for input(s): PROCALCITON, LATICACIDVEN in the last 168 hours.  No results found for this or any previous visit (from the past 240 hour(s)).       Radiology Studies: DG  Tibia/Fibula Left  Result Date: 11/28/2021 CLINICAL DATA:  LEFT leg pain after fall, rule out fracture. EXAM: LEFT TIBIA AND FIBULA - 2 VIEW COMPARISON:  None. FINDINGS: There is no evidence of fracture or other focal bone lesions. Soft tissues are unremarkable. IMPRESSION: Negative. Electronically Signed   By: Franki Cabot M.D.   On: 11/28/2021 17:31   CT Head Wo Contrast  Result Date: 11/28/2021 CLINICAL DATA:  Head trauma, minor EXAM: CT HEAD WITHOUT CONTRAST TECHNIQUE: Contiguous axial images were obtained from the base of the skull through the vertex without intravenous contrast. COMPARISON:  None. FINDINGS: Brain: Mild diffuse cerebral atrophy. No ventricular dilatation. No mass effect or midline shift. No abnormal extra-axial fluid collections. Gray-white matter junctions are distinct. Basal cisterns are not effaced. No acute intracranial hemorrhage. Vascular: Mild intracranial arterial vascular calcifications. Skull: Calvarium appears intact.  No destructive bone lesions. Sinuses/Orbits: Paranasal sinuses are clear. Opacification of mastoid air cells bilaterally. Other: None. IMPRESSION: 1. No acute intracranial abnormalities.  Mild cerebral atrophy. 2. Bilateral mastoid effusions. Electronically Signed   By: Lucienne Capers M.D.   On: 11/28/2021 18:30   US Venous Img Lower Bilateral  Result Date: 11/28/2021 CLINICAL DATA:  Bilateral lower extremity edema on chemotherapy EXAM: BILATERAL LOWER EXTREMITY VENOUS DOPPLER ULTRASOUND TECHNIQUE: Gray-scale sonography with graded compression, as well as color Doppler and duplex ultrasound were performed to evaluate the lower extremity deep venous systems from the level of the common femoral vein and including the common femoral, femoral, profunda femoral, popliteal and calf veins including the posterior tibial, peroneal and gastrocnemius veins when visible. The superficial great saphenous vein was also interrogated. Spectral Doppler was utilized to  evaluate flow at rest and with distal augmentation maneuvers in the common femoral, femoral and popliteal veins. COMPARISON:  None. FINDINGS: RIGHT LOWER EXTREMITY Common Femoral Vein: No evidence of thrombus. Normal compressibility, respiratory phasicity and response to augmentation. Saphenofemoral Junction: No evidence of thrombus. Normal compressibility and flow on color Doppler imaging. Profunda Femoral Vein: No evidence of thrombus. Normal compressibility and flow on color Doppler imaging. Femoral Vein: No evidence of thrombus. Normal compressibility, respiratory phasicity and response to augmentation. Popliteal Vein: No evidence of thrombus. Normal compressibility, respiratory phasicity and response to augmentation. Calf Veins: No evidence of thrombus. Normal compressibility and flow on color Doppler imaging. LEFT LOWER EXTREMITY Common Femoral Vein: No evidence of thrombus. Normal compressibility, respiratory phasicity and response to augmentation. Saphenofemoral Junction: No evidence of thrombus. Normal compressibility and flow on color Doppler imaging. Profunda Femoral Vein: No evidence of thrombus. Normal compressibility and flow on color Doppler imaging. Femoral Vein: No evidence of thrombus. Normal compressibility, respiratory phasicity and response to augmentation. Popliteal Vein: No evidence of thrombus. Normal compressibility, respiratory phasicity and response to augmentation. Calf Veins: Expansile appearance of the  calf veins with occlusive DVT. Other Findings:  None. IMPRESSION: 1. There is acute appearing DVT involving the left calf veins. The left popliteal and femoral veins are patent without involvement. 2. No findings of right lower extremity DVT. These results were called by telephone at the time of interpretation on 11/28/2021 at 3:32 pm to provider CARI TRIPLETT , who verbally acknowledged these results. Electronically Signed   By: Albin Felling M.D.   On: 11/28/2021 15:32         Scheduled Meds:  amLODipine  10 mg Oral Daily   apixaban  10 mg Oral BID   Followed by   Derrill Memo ON 12/05/2021] apixaban  5 mg Oral BID   pantoprazole (PROTONIX) IV  40 mg Intravenous Q12H   sevelamer carbonate  800 mg Oral TID WC   sodium bicarbonate  650 mg Oral BID   sodium chloride flush  3 mL Intravenous Q12H   Continuous Infusions:  sodium chloride 50 mL/hr at 11/28/21 2324     LOS: 1 day    Time spent: 33 mins     Wyvonnia Dusky, MD [facility] Pager [phone]  If 7PM-7AM, please contact night-coverage 11/29/2021, 7:54 AM

## 2021-11-29 NOTE — Consult Note (Signed)
[facility]  CONSULT NOTE    Date: 11/29/2021                  Patient Name:  Brianna Hamilton  MRN: [id number]  DOB: 08/16/1956  Age / Sex: 65 y.o., female         PCP: Marinda Elk, MD                 Service Requesting Consult: [facility]                 Reason for Consult: Chronic kidney disease stage IV            History of Present Illness: Brianna Hamilton is a 65 y.o.  female with  past medical history falls, hypertension, GERD, chronic kidney disease stage IV with recovered kidney function, who was admitted to [facility] on 11/28/2021 for Left leg DVT ([place]) [I82.402] Multiple falls [R29.6] DVT, lower extremity, distal, acute, left ([place]) [I82.4Z2] Chronic renal failure, stage 5 ([place]) [N18.5]  Patient is known our practice from previous admission in 2018. She was previously on dialysis for 5 years and decided not to continue. She has some residual function and continues to follow [facility] for nephrology. She states she fell and hit her head. She also has leg swelling. She is recently been diagnosed and currently undergoing chemotherapy for liver cancer. Patient's sister is at bedside and states she has been able to live alone until 3 months ago. Patient denies nausea and vomiting. Denies shortness of breath. Per sister, she experiences mild side effects from chemo such as fatigue.  They missed last Nephrology appt in October due to being overwhelmed with cancer appts.   Labs on arrival include sodium 134, BUN 67, Creatinine 3.99 with GFR 12. Imaging negative for acute changes.US shows DVT in Left calf area without blockage.   We have been consulted to monitor kidney function and appropriate Eliquis dosing.    Medications: Outpatient medications: Medications Prior to Admission  Medication Sig Dispense Refill Last Dose   acetaminophen (TYLENOL) 325 MG tablet Take 325 mg by mouth.   11/27/2021   amLODipine (NORVASC) 5 MG tablet Take 10 mg by mouth  daily.    11/28/2021   furosemide (LASIX) 40 MG tablet TAKE 1 TABLET (40 MG TOTAL) BY MOUTH TWO (2) TIMES A DAY.  11 11/28/2021   oxyCODONE (OXY IR/ROXICODONE) 5 MG immediate release tablet Take 1 tablet by mouth every 8 (eight) hours as needed.   11/28/2021   pantoprazole (PROTONIX) 40 MG tablet TAKE 1 TABLET (40 MG TOTAL) BY MOUTH DAILY BEFORE BREAKFAST. 60 tablet 0 11/27/2021   prochlorperazine (COMPAZINE) 10 MG tablet Take 10 mg by mouth every 6 (six) hours as needed.   Past Month   sevelamer carbonate (RENVELA) 800 MG tablet Take by mouth.   11/28/2021   sodium bicarbonate 650 MG tablet Take 650 mg by mouth 2 (two) times daily.   Past Week   valsartan (DIOVAN) 160 MG tablet Take 160 mg by mouth daily.  3 11/28/2021   vitamin B-12 (CYANOCOBALAMIN) 1000 MCG tablet Take 1,000 mcg by mouth daily.   11/27/2021   AFLURIA QUADRIVALENT 0.5 ML injection TO BE ADMINISTERED BY PHARMACIST FOR IMMUNIZATION  0    ethyl chloride spray Apply topically.       Current medications: Current Facility-Administered Medications  Medication Dose Route Frequency Provider Last Rate Last Admin   0.9 %  sodium chloride infusion   Intravenous Continuous  Para Skeans, MD 50 mL/hr at 11/29/21 0700 Infusion Verify at 11/29/21 0700   acetaminophen (TYLENOL) tablet 650 mg  650 mg Oral Q6H PRN Para Skeans, MD       Or   acetaminophen (TYLENOL) suppository 650 mg  650 mg Rectal Q6H PRN Para Skeans, MD       amLODipine (NORVASC) tablet 10 mg  10 mg Oral Daily Florina Ou V, MD   10 mg at 11/29/21 5284   apixaban (ELIQUIS) tablet 10 mg  10 mg Oral BID Para Skeans, MD   10 mg at 11/29/21 1324   Followed by   Derrill Memo ON 12/05/2021] apixaban (ELIQUIS) tablet 5 mg  5 mg Oral BID Para Skeans, MD       hydrALAZINE (APRESOLINE) injection 5 mg  5 mg Intravenous Q4H PRN Para Skeans, MD       pantoprazole (PROTONIX) injection 40 mg  40 mg Intravenous Q12H Florina Ou V, MD   40 mg at 11/29/21 0920   sevelamer carbonate  (RENVELA) packet 0.8 g  0.8 g Oral TID WC Chinita Greenland A, RPH   0.8 g at 11/29/21 1141   sodium bicarbonate tablet 650 mg  650 mg Oral BID Para Skeans, MD   650 mg at 11/29/21 0924   sodium chloride flush (NS) 0.9 % injection 3 mL  3 mL Intravenous Q12H Para Skeans, MD   3 mL at 11/29/21 4010      Allergies: Allergies  Allergen Reactions   Sulfa Antibiotics Other (See Comments)   Penicillins Rash      Past Medical History: Past Medical History:  Diagnosis Date   Bleeding stomach ulcer    Cancer ([place])    basal cell   Dialysis patient ([place])    GERD (gastroesophageal reflux disease)    Hypertension    Rectal prolapse    Renal disorder      Past Surgical History: Past Surgical History:  Procedure Laterality Date   ABDOMINAL HYSTERECTOMY     COLONOSCOPY WITH PROPOFOL N/A 02/04/2018   Procedure: COLONOSCOPY WITH PROPOFOL;  Surgeon: Lin Landsman, MD;  Location: [facility] ENDOSCOPY;  Service: Gastroenterology;  Laterality: N/A;   ESOPHAGOGASTRODUODENOSCOPY (EGD) WITH PROPOFOL N/A 12/18/2016   Procedure: ESOPHAGOGASTRODUODENOSCOPY (EGD) WITH PROPOFOL;  Surgeon: Jonathon Bellows, MD;  Location: [facility] ENDOSCOPY;  Service: Endoscopy;  Laterality: N/A;   ESOPHAGOGASTRODUODENOSCOPY (EGD) WITH PROPOFOL N/A 02/28/2017   Procedure: ESOPHAGOGASTRODUODENOSCOPY (EGD) WITH PROPOFOL;  Surgeon: Jonathon Bellows, MD;  Location: [facility] ENDOSCOPY;  Service: Endoscopy;  Laterality: N/A;     Family History: Family History  Problem Relation Age of Onset   Breast cancer Neg Hx      Social History: Social History   Socioeconomic History   Marital status: Single    Spouse name: Not on file   Number of children: Not on file   Years of education: Not on file   Highest education level: Not on file  Occupational History   Not on file  Tobacco Use   Smoking status: Never   Smokeless tobacco: Never  Substance and Sexual Activity   Alcohol use: No   Drug use: No   Sexual activity: Never  Other  Topics Concern   Not on file  Social History Narrative   Not on file   Social Determinants of Health   Financial Resource Strain: Not on file  Food Insecurity: Not on file  Transportation Needs: Not on file  Physical Activity: Not on file  Stress: Not on file  Social Connections: Not on file  Intimate Partner Violence: Not on file     Review of Systems: Review of Systems  Constitutional:  Negative for chills, fever and malaise/fatigue.  HENT:  Negative for congestion, sore throat and tinnitus.   Eyes:  Negative for blurred vision and redness.  Respiratory:  Negative for cough, shortness of breath and wheezing.   Cardiovascular:  Positive for leg swelling. Negative for chest pain, palpitations and claudication.  Gastrointestinal:  Negative for abdominal pain, blood in stool, diarrhea, nausea and vomiting.  Genitourinary:  Negative for flank pain, frequency and hematuria.  Musculoskeletal:  Positive for falls. Negative for back pain and myalgias.  Skin:  Negative for rash.  Neurological:  Negative for dizziness, weakness and headaches.  Endo/Heme/Allergies:  Does not bruise/bleed easily.  Psychiatric/Behavioral:  Negative for depression. The patient is not nervous/anxious and does not have insomnia.    Vital Signs: Blood pressure 120/78, pulse 69, temperature 97.8 F (36.6 C), temperature source Oral, resp. rate 20, height 5\' 5"  (1.651 m), weight 60.8 kg, SpO2 100 %.  Weight trends: Filed Weights   11/28/21 1355  Weight: 60.8 kg    Physical Exam: General: NAD, resting in bed  Head: Normocephalic, atraumatic. Moist oral mucosal membranes  Eyes: Anicteric, PERRL  Lungs:  Clear to auscultation, normal effort  Heart: Regular rate and rhythm  Abdomen:  Soft, nontender  Extremities:  trace peripheral edema.  Neurologic: Nonfocal, moving all four extremities  Skin: No lesions  Access: RUE AVF     Lab results: Basic Metabolic Panel: Recent Labs  Lab 11/28/21 1356  11/29/21 0513  NA 134* 136  K 3.7 3.4*  CL 100 105  CO2 26 24  GLUCOSE 99 78  BUN 67* 68*  CREATININE 3.99* 4.04*  CALCIUM 8.8* 8.4*    Liver Function Tests: Recent Labs  Lab 11/28/21 1356 11/29/21 0513  AST 24 18  ALT 23 19  ALKPHOS 156* 135*  BILITOT 0.8 0.7  PROT 5.1* 4.2*  ALBUMIN 2.3* 1.9*   No results for input(s): LIPASE, AMYLASE in the last 168 hours. No results for input(s): AMMONIA in the last 168 hours.  CBC: Recent Labs  Lab 11/28/21 1356 11/29/21 0513  WBC 3.2* 2.4*  NEUTROABS 1.9  --   HGB 9.5* 7.7*  HCT 28.6* 23.2*  MCV 98.3 95.9  PLT 244 149*    Cardiac Enzymes: No results for input(s): CKTOTAL, CKMB, CKMBINDEX, TROPONINI in the last 168 hours.  BNP: Invalid input(s): POCBNP  CBG: No results for input(s): GLUCAP in the last 168 hours.  Microbiology: Results for orders placed or performed during the hospital encounter of 12/18/16  MRSA PCR Screening     Status: None   Collection Time: 12/18/16  9:44 PM   Specimen: Nasopharyngeal  Result Value Ref Range Status   MRSA by PCR NEGATIVE NEGATIVE Final    Comment:        The GeneXpert MRSA Assay (FDA approved for NASAL specimens only), is one component of a comprehensive MRSA colonization surveillance program. It is not intended to diagnose MRSA infection nor to guide or monitor treatment for MRSA infections.     Coagulation Studies: Recent Labs    11/28/21 1957  LABPROT 13.7  INR 1.1    Urinalysis: No results for input(s): COLORURINE, LABSPEC, PHURINE, GLUCOSEU, HGBUR, BILIRUBINUR, KETONESUR, PROTEINUR, UROBILINOGEN, NITRITE, LEUKOCYTESUR in the last 72 hours.  Invalid input(s): APPERANCEUR    Imaging: DG Tibia/Fibula Left  Result Date:  11/28/2021 CLINICAL DATA:  LEFT leg pain after fall, rule out fracture. EXAM: LEFT TIBIA AND FIBULA - 2 VIEW COMPARISON:  None. FINDINGS: There is no evidence of fracture or other focal bone lesions. Soft tissues are unremarkable.  IMPRESSION: Negative. Electronically Signed   By: Franki Cabot M.D.   On: 11/28/2021 17:31   CT Head Wo Contrast  Result Date: 11/28/2021 CLINICAL DATA:  Head trauma, minor EXAM: CT HEAD WITHOUT CONTRAST TECHNIQUE: Contiguous axial images were obtained from the base of the skull through the vertex without intravenous contrast. COMPARISON:  None. FINDINGS: Brain: Mild diffuse cerebral atrophy. No ventricular dilatation. No mass effect or midline shift. No abnormal extra-axial fluid collections. Gray-white matter junctions are distinct. Basal cisterns are not effaced. No acute intracranial hemorrhage. Vascular: Mild intracranial arterial vascular calcifications. Skull: Calvarium appears intact.  No destructive bone lesions. Sinuses/Orbits: Paranasal sinuses are clear. Opacification of mastoid air cells bilaterally. Other: None. IMPRESSION: 1. No acute intracranial abnormalities.  Mild cerebral atrophy. 2. Bilateral mastoid effusions. Electronically Signed   By: Lucienne Capers M.D.   On: 11/28/2021 18:30   US Venous Img Lower Bilateral  Result Date: 11/28/2021 CLINICAL DATA:  Bilateral lower extremity edema on chemotherapy EXAM: BILATERAL LOWER EXTREMITY VENOUS DOPPLER ULTRASOUND TECHNIQUE: Gray-scale sonography with graded compression, as well as color Doppler and duplex ultrasound were performed to evaluate the lower extremity deep venous systems from the level of the common femoral vein and including the common femoral, femoral, profunda femoral, popliteal and calf veins including the posterior tibial, peroneal and gastrocnemius veins when visible. The superficial great saphenous vein was also interrogated. Spectral Doppler was utilized to evaluate flow at rest and with distal augmentation maneuvers in the common femoral, femoral and popliteal veins. COMPARISON:  None. FINDINGS: RIGHT LOWER EXTREMITY Common Femoral Vein: No evidence of thrombus. Normal compressibility, respiratory phasicity and response  to augmentation. Saphenofemoral Junction: No evidence of thrombus. Normal compressibility and flow on color Doppler imaging. Profunda Femoral Vein: No evidence of thrombus. Normal compressibility and flow on color Doppler imaging. Femoral Vein: No evidence of thrombus. Normal compressibility, respiratory phasicity and response to augmentation. Popliteal Vein: No evidence of thrombus. Normal compressibility, respiratory phasicity and response to augmentation. Calf Veins: No evidence of thrombus. Normal compressibility and flow on color Doppler imaging. LEFT LOWER EXTREMITY Common Femoral Vein: No evidence of thrombus. Normal compressibility, respiratory phasicity and response to augmentation. Saphenofemoral Junction: No evidence of thrombus. Normal compressibility and flow on color Doppler imaging. Profunda Femoral Vein: No evidence of thrombus. Normal compressibility and flow on color Doppler imaging. Femoral Vein: No evidence of thrombus. Normal compressibility, respiratory phasicity and response to augmentation. Popliteal Vein: No evidence of thrombus. Normal compressibility, respiratory phasicity and response to augmentation. Calf Veins: Expansile appearance of the calf veins with occlusive DVT. Other Findings:  None. IMPRESSION: 1. There is acute appearing DVT involving the left calf veins. The left popliteal and femoral veins are patent without involvement. 2. No findings of right lower extremity DVT. These results were called by telephone at the time of interpretation on 11/28/2021 at 3:32 pm to provider CARI TRIPLETT , who verbally acknowledged these results. Electronically Signed   By: Albin Felling M.D.   On: 11/28/2021 15:32     Assessment & Plan: Brianna Hamilton is a 65 y.o.  female with  past medical history falls, hypertension, GERD, chronic kidney disease stage IV with recovered kidney function, who was admitted to [facility] on 11/28/2021 for Left leg DVT ([place]) [I82.402]  Multiple falls  [R29.6] DVT, lower extremity, distal, acute, left ([facility]) [I82.4Z2] Chronic renal failure, stage 5 ([facility]) [N18.5]  Chronic kidney disease Stage IV with baseline creatinine of 3.01 with GFR 17 on 10/11/21. Followed by [facility] outpatient. Discontinued dialysis 3 years ago with residual function. Avoid nephrotoxic agents and therapies. Will monitor during admission and recommend follow up with [facility] at discharge  Hypertension with chronic kidney disease. Home regimen includes amlodipine, valsartan and furosemide. Furosemide currently held. BP stable  Anemia of chronic kidney disease  Lab Results  Component Value Date   HGB 7.7 (L) 11/29/2021    Hgn below target. Will continue to monitor.   4. Secondary Hyperparathyroidism:  Lab Results  Component Value Date   CALCIUM 8.4 (L) 11/29/2021   PHOS 3.5 03/09/2013    Calcium and phosphorus within acceptable range. Continue Sevelamer with meals.  5. DVT of LLE witnessed on lower extremity US. Eliquis as prescribed.    LOS: [place] 12/28/20222:30 PM

## 2021-11-29 NOTE — Evaluation (Signed)
Physical Therapy Evaluation Patient Details Name: Brianna Hamilton MRN: [id number] DOB: Mar 20, 1956 Today's Date: 11/29/2021  History of Present Illness  Brianna Hamilton is a 65 y.o. female with history of chronic kidney disease, liver cancer, mental handicap presents for evaluation of left ankle pain and lower extremity swelling; admitted for management of generalized weakness/fall, L LE DVT (anticoag started 12/27)  Clinical Impression  Patient resting in bed upon arrival to room; sister at bedside.  Requires mod encouragement for participation with session; appears fearful of falling due to perceived weakness in bilat LEs.  Denies pain; no clinical indicators of pain appreciated.  Able to complete bed mobility with mod indep; sit/stand, basic transfers and gait (5' x2) with Rw, cga/min assist.  Demonstrates 3-point, step to gait pattern; fair step height/length. Min cuing for walker position/management, quick to release grasp and step away from.  Frequent cuing for safety throughout session.  Refuses additional gait distance this AM; will continue to assess/progress in subsequent sessions. Would benefit from skilled PT to address above deficits and promote optimal return to PLOF.; Recommend transition to HHPT upon discharge from acute hospitalization.      Recommendations for follow up therapy are one component of a multi-disciplinary discharge planning process, led by the attending physician.  Recommendations may be updated based on patient status, additional functional criteria and insurance authorization.  Follow Up Recommendations Home health PT    Assistance Recommended at Discharge Intermittent Supervision/Assistance  Functional Status Assessment Patient has had a recent decline in their functional status and demonstrates the ability to make significant improvements in function in a reasonable and predictable amount of time.  Equipment Recommendations       Recommendations for  Other Services       Precautions / Restrictions Precautions Precautions: Fall Restrictions Weight Bearing Restrictions: No      Mobility  Bed Mobility Overal bed mobility: Modified Independent             General bed mobility comments: received and left in sitting    Transfers Overall transfer level: Needs assistance Equipment used: Rolling walker (2 wheels) Transfers: Sit to/from Stand Sit to Stand: Min guard           General transfer comment: cuing for hand placement, tends to pull on RW    Ambulation/Gait Ambulation/Gait assistance: Min guard Gait Distance (Feet):  (5' x2) Assistive device: Rolling walker (2 wheels)         General Gait Details: 3-point, step to gait pattern; fair step height/length. Min cuing for walker position/management, quick to release grasp and step away from  Stairs            Wheelchair Mobility    Modified Rankin (Stroke Patients Only)       Balance Overall balance assessment: Needs assistance Sitting-balance support: Feet supported;No upper extremity supported Sitting balance-Leahy Scale: Good     Standing balance support: Bilateral upper extremity supported Standing balance-Leahy Scale: Fair                               Pertinent Vitals/Pain Pain Assessment: No/denies pain    Home Living Family/patient expects to be discharged to:: Private residence Living Arrangements: Alone Available Help at Discharge: Family;Available PRN/intermittently Type of Home: House         Home Layout: One level Home Equipment: Conservation officer, nature (2 wheels)      Prior Function Prior Level of Function : Independent/Modified Independent  Mobility Comments: Indep with household mobilization at baseline; intermittent use of RW since recent initiation of CA treatment.  Sister drives and assists with community errands/needs ADLs Comments: Sister assists with ADLs, iADLs as needed     Hand  Dominance   Dominant Hand: Right    Extremity/Trunk Assessment   Upper Extremity Assessment Upper Extremity Assessment: Overall WFL for tasks assessed    Lower Extremity Assessment Lower Extremity Assessment: Overall WFL for tasks assessed (grossly at least 4/5 throughout)       Communication   Communication: HOH  Cognition Arousal/Alertness: Awake/alert Behavior During Therapy: WFL for tasks assessed/performed;Restless Overall Cognitive Status: History of cognitive impairments - at baseline                                          General Comments      Exercises Other Exercises Other Exercises: Toilet transfer, SPT with RW, min assist; sit/stand from [facility] with RW, min assist; standing balance for hygiene, peri-care, min assist. Other Exercises: Orthostatic assessment-see vitals flowsheet for details. Mild drop in SBP, but asymptomatic with all position changes.   Assessment/Plan    PT Assessment Patient needs continued PT services  PT Problem List Decreased strength;Decreased activity tolerance;Decreased balance;Decreased mobility;Decreased cognition;Decreased knowledge of use of DME;Decreased safety awareness;Decreased knowledge of precautions       PT Treatment Interventions DME instruction;Gait training;Stair training;Patient/family education;Therapeutic activities;Functional mobility training;Cognitive remediation;Balance training;Therapeutic exercise    PT Goals (Current goals can be found in the Care Plan section)  Acute Rehab PT Goals Patient Stated Goal: to go home PT Goal Formulation: With patient/family Time For Goal Achievement: 12/13/21 Potential to Achieve Goals: Good    Frequency Min 2X/week   Barriers to discharge        Co-evaluation               AM-PAC PT "6 Clicks" Mobility  Outcome Measure Help needed turning from your back to your side while in a flat bed without using bedrails?: None Help needed moving from lying on  your back to sitting on the side of a flat bed without using bedrails?: None Help needed moving to and from a bed to a chair (including a wheelchair)?: A Little Help needed standing up from a chair using your arms (e.g., wheelchair or bedside chair)?: A Little Help needed to walk in hospital room?: A Little Help needed climbing 3-5 steps with a railing? : A Little 6 Click Score: 20    End of Session Equipment Utilized During Treatment: Gait belt Activity Tolerance: Patient tolerated treatment well Patient left: in chair;with call bell/phone within reach;with chair alarm set (OT present to complete evaluation) Nurse Communication: Mobility status PT Visit Diagnosis: Muscle weakness (generalized) (M62.81);Difficulty in walking, not elsewhere classified (R26.2)    Time: 5003-7048 PT Time Calculation (min) (ACUTE ONLY): 23 min   Charges:   PT Evaluation $PT Eval Moderate Complexity: 1 Mod         Money Mckeithan H. Owens Shark, PT, DPT, NCS 11/29/21, 2:26 PM [phone]

## 2021-11-29 NOTE — Care Management CC44 (Signed)
Condition Code 44 Documentation Completed  Patient Details  Name: Brianna Hamilton MRN: [id number] Date of Birth: 18-Dec-1955   Condition Code 44 given:  yes  Patient signature on Condition Code 44 notice:  unable to sign  Documentation of 2 MD's agreement:   yes Code 44 added to claim:   yes    Pete Pelt, RN 11/29/2021, 4:05 PM

## 2021-11-29 NOTE — Care Management (Signed)
Patient unable to hear for assessment, sister's number on file is disconnected.

## 2021-11-30 DIAGNOSIS — D638 Anemia in other chronic diseases classified elsewhere: Secondary | ICD-10-CM | POA: Diagnosis not present

## 2021-11-30 DIAGNOSIS — I824Y2 Acute embolism and thrombosis of unspecified deep veins of left proximal lower extremity: Secondary | ICD-10-CM | POA: Diagnosis not present

## 2021-11-30 DIAGNOSIS — N184 Chronic kidney disease, stage 4 (severe): Secondary | ICD-10-CM | POA: Diagnosis not present

## 2021-11-30 LAB — BASIC METABOLIC PANEL
Anion gap: 5 (ref 5–15)
BUN: 65 mg/dL — ABNORMAL HIGH (ref 8–23)
CO2: 24 mmol/L (ref 22–32)
Calcium: 8.2 mg/dL — ABNORMAL LOW (ref 8.9–10.3)
Chloride: 105 mmol/L (ref 98–111)
Creatinine, Ser: 3.82 mg/dL — ABNORMAL HIGH (ref 0.44–1.00)
GFR, Estimated: 12 mL/min — ABNORMAL LOW (ref >60)
Glucose, Bld: 96 mg/dL (ref 70–99)
Potassium: 3.5 mmol/L (ref 3.5–5.1)
Sodium: 134 mmol/L — ABNORMAL LOW (ref 135–145)

## 2021-11-30 LAB — CBC
HCT: 23.1 % — ABNORMAL LOW (ref 36.0–46.0)
Hemoglobin: 7.7 g/dL — ABNORMAL LOW (ref 12.0–15.0)
MCH: 32.2 pg (ref 26.0–34.0)
MCHC: 33.3 g/dL (ref 30.0–36.0)
MCV: 96.7 fL (ref 80.0–100.0)
Platelets: 157 10*3/uL (ref 150–400)
RBC: 2.39 MIL/uL — ABNORMAL LOW (ref 3.87–5.11)
RDW: 17.9 % — ABNORMAL HIGH (ref 11.5–15.5)
WBC: 2.6 10*3/uL — ABNORMAL LOW (ref 4.0–10.5)
nRBC: 0 % (ref 0.0–0.2)

## 2021-11-30 NOTE — Progress Notes (Signed)
PROGRESS NOTE    Brianna Hamilton  DXI:[id number] DOB: 11-Jul-1956 DOA: 11/28/2021 PCP: Marinda Elk, MD  Assessment & Plan:   Principal Problem:   Falls Active Problems:   Duodenal ulcer   Anemia   Chronic kidney disease, stage IV (severe) (HCC)   Hypertension   Hearing loss   Mild intellectual disability   DVT (deep venous thrombosis) (HCC)   Leg swelling   Left leg DVT (HCC)   Falls: PT/OT recs home health  DVT of LLE: continue on eliquis   HTN: continue on home dose of CCB  Hx of duodenal ulcer: continue on pantoprazole   CKDIV: Cr is trending down from day prior. Previously on HD 3 years ago but it was d/c as pt had residual function as per nephro.   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 Will transfuse if Hb < 7.0   Leukopenia: etiology unclear, possibly secondary to recent chemo. Trending up from day prior   Mild intellectual disability: at baseline as per pt's sister. Continue w/ supportive care    DVT prophylaxis: eliquis  Code Status: full  Family Communication: discussed pt's care w/ pt's sister at bedside and answered her questions  Disposition Plan: likely d/c home w/ HH    Level of care: Telemetry Medical  Status is: Inpatient  Remains inpatient appropriate because: severity of illness     Consultants:  Nephro   Procedures:   Antimicrobials:    Subjective: Pt c/o fatigue   Objective: Vitals:   11/29/21 1613 11/29/21 1951 11/29/21 2357 11/30/21 0450  BP: 111/68 129/67 105/66 119/66  Pulse: 62 66 64 65  Resp: 20 18 14 18   Temp: 97.9 F (36.6 C) 98.1 F (36.7 C) 97.7 F (36.5 C) 97.6 F (36.4 C)  TempSrc: Oral Oral Oral Oral  SpO2: 100% 99% 99% 99%  Weight:      Height:       No intake or output data in the 24 hours ending 11/30/21 0734  Filed Weights   11/28/21 1355  Weight: 60.8 kg    Examination:  General exam: Appears comfortable  Respiratory system: diminished breath sounds b/l  Cardiovascular  system: S1/S2+. No rubs or clicks  Gastrointestinal system: Abd is soft, NT, ND & hypoactive bowel sounds  Central nervous system: Alert and awake. Moves all extremities  Psychiatry: Judgement and insight appears poor. Flat mood and affect     Data Reviewed: I have personally reviewed following labs and imaging studies  CBC: Recent Labs  Lab 11/28/21 1356 11/29/21 0513 11/30/21 0504  WBC 3.2* 2.4* 2.6*  NEUTROABS 1.9  --   --   HGB 9.5* 7.7* 7.7*  HCT 28.6* 23.2* 23.1*  MCV 98.3 95.9 96.7  PLT 244 149* 767   Basic Metabolic Panel: Recent Labs  Lab 11/28/21 1356 11/29/21 0513 11/30/21 0504  NA 134* 136 134*  K 3.7 3.4* 3.5  CL 100 105 105  CO2 26 24 24   GLUCOSE 99 78 96  BUN 67* 68* 65*  CREATININE 3.99* 4.04* 3.82*  CALCIUM 8.8* 8.4* 8.2*   GFR: Estimated Creatinine Clearance: 12.9 mL/min (A) (by C-G formula based on SCr of 3.82 mg/dL (H)). Liver Function Tests: Recent Labs  Lab 11/28/21 1356 11/29/21 0513  AST 24 18  ALT 23 19  ALKPHOS 156* 135*  BILITOT 0.8 0.7  PROT 5.1* 4.2*  ALBUMIN 2.3* 1.9*   No results for input(s): LIPASE, AMYLASE in the last 168 hours. No results for input(s): AMMONIA  in the last 168 hours. Coagulation Profile: Recent Labs  Lab 11/28/21 1957  INR 1.1   Cardiac Enzymes: No results for input(s): CKTOTAL, CKMB, CKMBINDEX, TROPONINI in the last 168 hours. BNP (last 3 results) No results for input(s): PROBNP in the last 8760 hours. HbA1C: No results for input(s): HGBA1C in the last 72 hours. CBG: No results for input(s): GLUCAP in the last 168 hours. Lipid Profile: No results for input(s): CHOL, HDL, LDLCALC, TRIG, CHOLHDL, LDLDIRECT in the last 72 hours. Thyroid Function Tests: No results for input(s): TSH, T4TOTAL, FREET4, T3FREE, THYROIDAB in the last 72 hours. Anemia Panel: Recent Labs    11/28/21 1957  VITAMINB12 2,049*  FOLATE 82.0  FERRITIN 1,112*  TIBC 115*  IRON 40  RETICCTPCT 2.8   Sepsis Labs: No  results for input(s): PROCALCITON, LATICACIDVEN in the last 168 hours.  No results found for this or any previous visit (from the past 240 hour(s)).       Radiology Studies: DG Tibia/Fibula Left  Result Date: 11/28/2021 CLINICAL DATA:  LEFT leg pain after fall, rule out fracture. EXAM: LEFT TIBIA AND FIBULA - 2 VIEW COMPARISON:  None. FINDINGS: There is no evidence of fracture or other focal bone lesions. Soft tissues are unremarkable. IMPRESSION: Negative. Electronically Signed   By: Franki Cabot M.D.   On: 11/28/2021 17:31   CT Head Wo Contrast  Result Date: 11/28/2021 CLINICAL DATA:  Head trauma, minor EXAM: CT HEAD WITHOUT CONTRAST TECHNIQUE: Contiguous axial images were obtained from the base of the skull through the vertex without intravenous contrast. COMPARISON:  None. FINDINGS: Brain: Mild diffuse cerebral atrophy. No ventricular dilatation. No mass effect or midline shift. No abnormal extra-axial fluid collections. Gray-white matter junctions are distinct. Basal cisterns are not effaced. No acute intracranial hemorrhage. Vascular: Mild intracranial arterial vascular calcifications. Skull: Calvarium appears intact.  No destructive bone lesions. Sinuses/Orbits: Paranasal sinuses are clear. Opacification of mastoid air cells bilaterally. Other: None. IMPRESSION: 1. No acute intracranial abnormalities.  Mild cerebral atrophy. 2. Bilateral mastoid effusions. Electronically Signed   By: Lucienne Capers M.D.   On: 11/28/2021 18:30   US Venous Img Lower Bilateral  Result Date: 11/28/2021 CLINICAL DATA:  Bilateral lower extremity edema on chemotherapy EXAM: BILATERAL LOWER EXTREMITY VENOUS DOPPLER ULTRASOUND TECHNIQUE: Gray-scale sonography with graded compression, as well as color Doppler and duplex ultrasound were performed to evaluate the lower extremity deep venous systems from the level of the common femoral vein and including the common femoral, femoral, profunda femoral, popliteal and  calf veins including the posterior tibial, peroneal and gastrocnemius veins when visible. The superficial great saphenous vein was also interrogated. Spectral Doppler was utilized to evaluate flow at rest and with distal augmentation maneuvers in the common femoral, femoral and popliteal veins. COMPARISON:  None. FINDINGS: RIGHT LOWER EXTREMITY Common Femoral Vein: No evidence of thrombus. Normal compressibility, respiratory phasicity and response to augmentation. Saphenofemoral Junction: No evidence of thrombus. Normal compressibility and flow on color Doppler imaging. Profunda Femoral Vein: No evidence of thrombus. Normal compressibility and flow on color Doppler imaging. Femoral Vein: No evidence of thrombus. Normal compressibility, respiratory phasicity and response to augmentation. Popliteal Vein: No evidence of thrombus. Normal compressibility, respiratory phasicity and response to augmentation. Calf Veins: No evidence of thrombus. Normal compressibility and flow on color Doppler imaging. LEFT LOWER EXTREMITY Common Femoral Vein: No evidence of thrombus. Normal compressibility, respiratory phasicity and response to augmentation. Saphenofemoral Junction: No evidence of thrombus. Normal compressibility and flow on color Doppler imaging. Profunda  Femoral Vein: No evidence of thrombus. Normal compressibility and flow on color Doppler imaging. Femoral Vein: No evidence of thrombus. Normal compressibility, respiratory phasicity and response to augmentation. Popliteal Vein: No evidence of thrombus. Normal compressibility, respiratory phasicity and response to augmentation. Calf Veins: Expansile appearance of the calf veins with occlusive DVT. Other Findings:  None. IMPRESSION: 1. There is acute appearing DVT involving the left calf veins. The left popliteal and femoral veins are patent without involvement. 2. No findings of right lower extremity DVT. These results were called by telephone at the time of  interpretation on 11/28/2021 at 3:32 pm to provider CARI TRIPLETT , who verbally acknowledged these results. Electronically Signed   By: Albin Felling M.D.   On: 11/28/2021 15:32        Scheduled Meds:  amLODipine  10 mg Oral Daily   apixaban  10 mg Oral BID   Followed by   Derrill Memo ON 12/05/2021] apixaban  5 mg Oral BID   pantoprazole (PROTONIX) IV  40 mg Intravenous Q12H   sevelamer carbonate  0.8 g Oral TID WC   sodium bicarbonate  650 mg Oral BID   sodium chloride flush  3 mL Intravenous Q12H   Continuous Infusions:     LOS: 1 day    Time spent: 30 mins     Wyvonnia Dusky, MD [facility] Pager [phone]  If 7PM-7AM, please contact night-coverage 11/30/2021, 7:34 AM

## 2021-11-30 NOTE — Plan of Care (Signed)
  Problem: Health Behavior/Discharge Planning: Goal: Ability to manage health-related needs will improve Outcome: Progressing   

## 2021-11-30 NOTE — TOC Progression Note (Signed)
Transition of Care [facility]) - Progression Note    Patient Details  Name: Brianna Hamilton MRN: [id number] Date of Birth: 29-Mar-1956  Transition of Care [facility]) CM/SW [place], RN Phone Number: 11/30/2021, 3:59 PM  Clinical Narrative:   Patient cannot hear, and RNCM tried to write messages, and patient states she cannot read.  Spoke with patient's sister.  Patient lives alone.  Sister lives close by, and helps when she can.  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  Sister states patient has cognitive disabilities.    Sister, Brianna Hamilton states that she transports patient to appointments and to the pharmacy.  Patient has a pill box that she is able to fill herself and takes as directed with sister's supervision.  Sister is amenable to [facility], who accepts Home Health care for patient as per Malachy Mood at agency.    DME, 3 n 1 ordered.  [facility] does not have them in supply today, RNCM will check tomorrow, but sister is amenable to purchasing one if necessary.  As per patient and sister, they plan to take patient home tomorrow and sister will stay with patient at patient's house.         Expected Discharge Plan and Services                                                 Social Determinants of Health (SDOH) Interventions    Readmission Risk Interventions No flowsheet data found.

## 2021-11-30 NOTE — Progress Notes (Signed)
Occupational Therapy Treatment Patient Details Name: Brianna Hamilton MRN: [id number] DOB: 1956/06/29 Today's Date: 11/30/2021   History of present illness Brianna Hamilton is a 65 y.o. female with history of chronic kidney disease, liver cancer, mental handicap presents for evaluation of left ankle pain and lower extremity swelling; admitted for management of generalized weakness/fall, L LE DVT (anticoag started 12/27)   OT comments  [name] made good effort today. Initially reluctant to participate in therapy, but, with coaxing, agreeable to bed mobility and working on standing balance tolerance. Pt states she needs to use toilet, requested that therapist move BSC near bed, as pt said her legs were "too weak" to walk any distance. Pt able to transition sit<>stand with CGA, required Min A for transfer to [facility], given unsteadiness in standing. Able to perform peri-care in standing with only SUPV from therapist. Will continue to follow POC; anticipate DC with [name].   Recommendations for follow up therapy are one component of a multi-disciplinary discharge planning process, led by the attending physician.  Recommendations may be updated based on patient status, additional functional criteria and insurance authorization.    Follow Up Recommendations  Home health OT    Assistance Recommended at Discharge    Equipment Recommendations  BSC/3in1    Recommendations for Other Services      Precautions / Restrictions Precautions Precautions: Fall Restrictions Weight Bearing Restrictions: No       Mobility Bed Mobility Overal bed mobility: Modified Independent                  Transfers Overall transfer level: Needs assistance Equipment used: Rolling walker (2 wheels) Transfers: Sit to/from Stand;Bed to chair/wheelchair/BSC Sit to Stand: Min guard;Min assist           General transfer comment: CGA for sit<>stand, Min A for t/f     Balance Overall balance  assessment: Needs assistance Sitting-balance support: Feet supported;No upper extremity supported Sitting balance-Leahy Scale: Good     Standing balance support: Bilateral upper extremity supported Standing balance-Leahy Scale: Fair                             ADL either performed or assessed with clinical judgement   ADL Overall ADL's : Needs assistance/impaired Eating/Feeding: Modified independent;Set up   Grooming: Wash/dry hands;Wash/dry face;Set up;Modified independent                   Toilet Transfer: Minimal assistance;BSC/3in1;Stand-pivot   Toileting- Clothing Manipulation and Hygiene: Supervision/safety         General ADL Comments: CGA for ambulation    Extremity/Trunk Assessment Upper Extremity Assessment Upper Extremity Assessment: Overall WFL for tasks assessed   Lower Extremity Assessment Lower Extremity Assessment: Overall WFL for tasks assessed        Vision       Perception     Praxis      Cognition Arousal/Alertness: Awake/alert Behavior During Therapy: WFL for tasks assessed/performed;Restless Overall Cognitive Status: History of cognitive impairments - at baseline                                            Exercises Other Exercises Other Exercises: Toilet transfer, stand-pivot t/f w/ RW, min assist; sit/stand from [facility] with RW, CGA; SUPV for standing peri-care   Shoulder Instructions  General Comments Bruising around L eye    Pertinent Vitals/ Pain       Pain Assessment: No/denies pain  Home Living                                          Prior Functioning/Environment              Frequency  Min 2X/week        Progress Toward Goals  OT Goals(current goals can now be found in the care plan section)  Progress towards OT goals: Progressing toward goals  Acute Rehab OT Goals Patient Stated Goal: to go home OT Goal Formulation: With patient/family Time For  Goal Achievement: 12/13/21 Potential to Achieve Goals: Good  Plan Discharge plan remains appropriate;Frequency remains appropriate    Co-evaluation                 AM-PAC OT "6 Clicks" Daily Activity     Outcome Measure   Help from another person eating meals?: None Help from another person taking care of personal grooming?: A Little Help from another person toileting, which includes using toliet, bedpan, or urinal?: A Little Help from another person bathing (including washing, rinsing, drying)?: A Little Help from another person to put on and taking off regular upper body clothing?: None Help from another person to put on and taking off regular lower body clothing?: A Little 6 Click Score: 20    End of Session Equipment Utilized During Treatment: Rolling walker (2 wheels)  OT Visit Diagnosis: Unsteadiness on feet (R26.81)   Activity Tolerance Patient tolerated treatment well   Patient Left in bed;with call bell/phone within reach;with bed alarm set   Nurse Communication          Time: 6606-3016 OT Time Calculation (min): 9 min  Charges: OT General Charges $OT Visit: 1 Visit OT Treatments $Self Care/Home Management : 8-22 mins  Josiah Lobo, PhD, MS, OTR/L 11/30/21, 2:18 PM

## 2021-12-01 DIAGNOSIS — I824Y2 Acute embolism and thrombosis of unspecified deep veins of left proximal lower extremity: Secondary | ICD-10-CM | POA: Diagnosis not present

## 2021-12-01 DIAGNOSIS — N184 Chronic kidney disease, stage 4 (severe): Secondary | ICD-10-CM | POA: Diagnosis not present

## 2021-12-01 DIAGNOSIS — W19XXXD Unspecified fall, subsequent encounter: Secondary | ICD-10-CM | POA: Diagnosis not present

## 2021-12-01 LAB — BASIC METABOLIC PANEL
Anion gap: 5 (ref 5–15)
BUN: 63 mg/dL — ABNORMAL HIGH (ref 8–23)
CO2: 26 mmol/L (ref 22–32)
Calcium: 8.3 mg/dL — ABNORMAL LOW (ref 8.9–10.3)
Chloride: 104 mmol/L (ref 98–111)
Creatinine, Ser: 3.67 mg/dL — ABNORMAL HIGH (ref 0.44–1.00)
GFR, Estimated: 13 mL/min — ABNORMAL LOW (ref >60)
Glucose, Bld: 96 mg/dL (ref 70–99)
Potassium: 3.3 mmol/L — ABNORMAL LOW (ref 3.5–5.1)
Sodium: 135 mmol/L (ref 135–145)

## 2021-12-01 LAB — CBC
HCT: 23.8 % — ABNORMAL LOW (ref 36.0–46.0)
Hemoglobin: 7.9 g/dL — ABNORMAL LOW (ref 12.0–15.0)
MCH: 32.6 pg (ref 26.0–34.0)
MCHC: 33.2 g/dL (ref 30.0–36.0)
MCV: 98.3 fL (ref 80.0–100.0)
Platelets: 165 10*3/uL (ref 150–400)
RBC: 2.42 MIL/uL — ABNORMAL LOW (ref 3.87–5.11)
RDW: 18.1 % — ABNORMAL HIGH (ref 11.5–15.5)
WBC: 3.4 10*3/uL — ABNORMAL LOW (ref 4.0–10.5)
nRBC: 0 % (ref 0.0–0.2)

## 2021-12-01 MED ORDER — APIXABAN 5 MG PO TABS: 10.0000 mg | ORAL_TABLET | Freq: Two times a day (BID) | ORAL | 0 refills | Status: AC

## 2021-12-01 MED ORDER — APIXABAN 5 MG PO TABS
5.0000 mg | ORAL_TABLET | Freq: Two times a day (BID) | ORAL | 0 refills | Status: AC
Start: 2021-12-05 — End: 2022-01-04

## 2021-12-01 NOTE — Progress Notes (Signed)
Mobility Specialist - Progress Note   12/01/21 1000  Mobility  Activity Transferred:  Bed to chair  Range of Motion/Exercises Active  Level of Assistance Standby assist, set-up cues, supervision of patient - no hands on  Assistive Device Front wheel walker  Distance Ambulated (ft) 3 ft  Mobility Out of bed to chair with meals  Mobility Response Tolerated well  Mobility performed by Mobility specialist  $Mobility charge 1 Mobility    Pt sleeping on arrival, utilizing RA. Awakened by voice. Pt agitated, but agreeable to session. Voiced pain in LLE. Pt sat EOB without physical assist. Washed face prior to standing and transferring to chair with HHA. No LOB. Pt left in chair with needs in reach. Sister at bedside.    Kathee Delton Mobility Specialist 12/01/21, 10:48 AM

## 2021-12-01 NOTE — Progress Notes (Signed)
Per patient and sister in room no legal guardian this was entered previously in error

## 2021-12-01 NOTE — Discharge Summary (Signed)
Physician Discharge Summary  Brianna Hamilton TOI:[id number] DOB: [birth year] DOA: 11/28/2021  PCP: Marinda Elk, MD  Admit date: 11/28/2021 Discharge date: 12/01/2021  Admitted From: home  Disposition:  home w/ home health   Recommendations for Outpatient Follow-up:  Follow up with PCP in 1-2 weeks   Home Health: yes Equipment/Devices:  Discharge Condition: stable  CODE STATUS: full  Diet recommendation: Heart Healthy  Brief/Interim Summary: HPI was taken from Dr. Florina Ou: Brianna Hamilton is a 65 y.o. female seen in ed with complaints of Falls and left leg swelling more than right.  Patient is said to be living home alone for several years and doing well by herself with close supervision from her sister.  Since starting her chemotherapy patient has had falls and has been having leg swelling and has been little weak generally in just not doing well according to the sister.  They called her primary oncologist who recommended to come to the emergency room for evaluation of blood clots because of the leg swelling.  Patient is hard of hearing and is agitated and denies any complaints states she is not hurting anywhere but allows me to examine her briefly.Sister at bedside.  Sitter reports that patient was a dialysis patient in the past and has been off of dialysis for about 3 years.     Pt has past medical history of allergies to sulfa and penicillin, stomach ulcer, cancer, dialysis, GERD, hypertension, rectal prolapse, solitary kidney congenital, anemia, hyperparathyroidism, diverticulosis.   Hospital course from Dr. Jimmye Norman 12/28-12/30/22: Pt was found to have DVT of LLE and was started on anticoagulation, eliquis. Pt has had a sedentary lifestyle after being dx w/ cancer as per pt's sister. PT/OT evaluated pt and recommended home health. Home health was set up by CM prior to d/c.   Discharge Diagnoses:  Principal Problem:   Falls Active Problems:   Duodenal ulcer    Anemia   Chronic kidney disease, stage IV (severe) (HCC)   Hypertension   Hearing loss   Mild intellectual disability   DVT (deep venous thrombosis) (HCC)   Leg swelling   Left leg DVT (HCC)  Falls: PT/OT recs home health   DVT of LLE: continue on eliquis    HTN: continue on home dose of CCB   Hx of duodenal ulcer: continue on pantoprazole    CKDIV: Cr continues to trend down daily. Previously on HD 3 years ago but it was d/c as pt had residual function as per nephro. Will f/u outpatient w/ nephro at [facility]    Likely ACD: H&H are stable from day prior. Will transfuse if Hb < 7.0    Leukopenia: etiology unclear, possibly secondary to recent chemo. Continues to trend up    Mild intellectual disability: at baseline as per pt's sister. Continue w/ supportive care   Discharge Instructions  Discharge Instructions     Diet - low sodium heart healthy   Complete by: As directed    Discharge instructions   Complete by: As directed    F/u w/ PCP in 1-2 weeks. Do not take any NSAIDs (examples: ibuprofen, advil, aleve, naproxen, naprosyn, etc) while taking eliquis. F/u w/ nephro at [facility] in 1-2 weeks   Increase activity slowly   Complete by: As directed       Allergies as of 12/01/2021       Reactions   Sulfa Antibiotics Other (See Comments)   Penicillins Rash        Medication List  TAKE these medications    acetaminophen 325 MG tablet Commonly known as: TYLENOL Take 325 mg by mouth.   Afluria Quadrivalent 0.5 ML injection Generic drug: influenza vac split quadrivalent PF TO BE ADMINISTERED BY PHARMACIST FOR IMMUNIZATION   amLODipine 5 MG tablet Commonly known as: NORVASC Take 10 mg by mouth daily.   apixaban 5 MG Tabs tablet Commonly known as: ELIQUIS Take 2 tablets (10 mg total) by mouth 2 (two) times daily for 3 days.   apixaban 5 MG Tabs tablet Commonly known as: ELIQUIS Take 1 tablet (5 mg total) by mouth 2 (two) times daily. Only start taking this rx  once you have completed the last 3 of 7 days of eliquis 10mg  BID. Start taking on: December 05, 2021   ethyl chloride spray Apply topically.   furosemide 40 MG tablet Commonly known as: LASIX TAKE 1 TABLET (40 MG TOTAL) BY MOUTH TWO (2) TIMES A DAY.   oxyCODONE 5 MG immediate release tablet Commonly known as: Oxy IR/ROXICODONE Take 1 tablet by mouth every 8 (eight) hours as needed.   pantoprazole 40 MG tablet Commonly known as: PROTONIX TAKE 1 TABLET (40 MG TOTAL) BY MOUTH DAILY BEFORE BREAKFAST.   prochlorperazine 10 MG tablet Commonly known as: COMPAZINE Take 10 mg by mouth every 6 (six) hours as needed.   sevelamer carbonate 800 MG tablet Commonly known as: RENVELA Take by mouth.   sodium bicarbonate 650 MG tablet Take 650 mg by mouth 2 (two) times daily.   valsartan 160 MG tablet Commonly known as: DIOVAN Take 160 mg by mouth daily.   vitamin B-12 1000 MCG tablet Commonly known as: CYANOCOBALAMIN Take 1,000 mcg by mouth daily.               Durable Medical Equipment  (From admission, onward)           Start     Ordered   12/01/21 0948  For home use only DME 3 n 1  Once        12/01/21 0947            Allergies  Allergen Reactions   Sulfa Antibiotics Other (See Comments)   Penicillins Rash    Consultations:    Procedures/Studies: DG Tibia/Fibula Left  Result Date: 11/28/2021 CLINICAL DATA:  LEFT leg pain after fall, rule out fracture. EXAM: LEFT TIBIA AND FIBULA - 2 VIEW COMPARISON:  None. FINDINGS: There is no evidence of fracture or other focal bone lesions. Soft tissues are unremarkable. IMPRESSION: Negative. Electronically Signed   By: Franki Cabot M.D.   On: 11/28/2021 17:31   CT Head Wo Contrast  Result Date: 11/28/2021 CLINICAL DATA:  Head trauma, minor EXAM: CT HEAD WITHOUT CONTRAST TECHNIQUE: Contiguous axial images were obtained from the base of the skull through the vertex without intravenous contrast. COMPARISON:  None.  FINDINGS: Brain: Mild diffuse cerebral atrophy. No ventricular dilatation. No mass effect or midline shift. No abnormal extra-axial fluid collections. Gray-white matter junctions are distinct. Basal cisterns are not effaced. No acute intracranial hemorrhage. Vascular: Mild intracranial arterial vascular calcifications. Skull: Calvarium appears intact.  No destructive bone lesions. Sinuses/Orbits: Paranasal sinuses are clear. Opacification of mastoid air cells bilaterally. Other: None. IMPRESSION: 1. No acute intracranial abnormalities.  Mild cerebral atrophy. 2. Bilateral mastoid effusions. Electronically Signed   By: Lucienne Capers M.D.   On: 11/28/2021 18:30   US Venous Img Lower Bilateral  Result Date: 11/28/2021 CLINICAL DATA:  Bilateral lower extremity edema on chemotherapy EXAM:  BILATERAL LOWER EXTREMITY VENOUS DOPPLER ULTRASOUND TECHNIQUE: Gray-scale sonography with graded compression, as well as color Doppler and duplex ultrasound were performed to evaluate the lower extremity deep venous systems from the level of the common femoral vein and including the common femoral, femoral, profunda femoral, popliteal and calf veins including the posterior tibial, peroneal and gastrocnemius veins when visible. The superficial great saphenous vein was also interrogated. Spectral Doppler was utilized to evaluate flow at rest and with distal augmentation maneuvers in the common femoral, femoral and popliteal veins. COMPARISON:  None. FINDINGS: RIGHT LOWER EXTREMITY Common Femoral Vein: No evidence of thrombus. Normal compressibility, respiratory phasicity and response to augmentation. Saphenofemoral Junction: No evidence of thrombus. Normal compressibility and flow on color Doppler imaging. Profunda Femoral Vein: No evidence of thrombus. Normal compressibility and flow on color Doppler imaging. Femoral Vein: No evidence of thrombus. Normal compressibility, respiratory phasicity and response to augmentation.  Popliteal Vein: No evidence of thrombus. Normal compressibility, respiratory phasicity and response to augmentation. Calf Veins: No evidence of thrombus. Normal compressibility and flow on color Doppler imaging. LEFT LOWER EXTREMITY Common Femoral Vein: No evidence of thrombus. Normal compressibility, respiratory phasicity and response to augmentation. Saphenofemoral Junction: No evidence of thrombus. Normal compressibility and flow on color Doppler imaging. Profunda Femoral Vein: No evidence of thrombus. Normal compressibility and flow on color Doppler imaging. Femoral Vein: No evidence of thrombus. Normal compressibility, respiratory phasicity and response to augmentation. Popliteal Vein: No evidence of thrombus. Normal compressibility, respiratory phasicity and response to augmentation. Calf Veins: Expansile appearance of the calf veins with occlusive DVT. Other Findings:  None. IMPRESSION: 1. There is acute appearing DVT involving the left calf veins. The left popliteal and femoral veins are patent without involvement. 2. No findings of right lower extremity DVT. These results were called by telephone at the time of interpretation on 11/28/2021 at 3:32 pm to provider Brianna Hamilton , who verbally acknowledged these results. Electronically Signed   By: Albin Felling M.D.   On: 11/28/2021 15:32   (Echo, Carotid, EGD, Colonoscopy, ERCP)    Subjective: Pt denies any complaints    Discharge Exam: Vitals:   12/01/21 0550 12/01/21 0745  BP: 117/68 131/75  Pulse: 72 80  Resp: 18 16  Temp: 98 F (36.7 C) 98 F (36.7 C)  SpO2: 100% 98%   Vitals:   11/30/21 1547 11/30/21 2024 12/01/21 0550 12/01/21 0745  BP: 104/65 104/63 117/68 131/75  Pulse: 65 76 72 80  Resp: 16 20 18 16   Temp: 97.8 F (36.6 C) 97.9 F (36.6 C) 98 F (36.7 C) 98 F (36.7 C)  TempSrc: Oral Oral Oral Oral  SpO2: 100% 100% 100% 98%  Weight:      Height:        General: Pt is alert, awake, not in acute  distress Cardiovascular: S1/S2 +, no rubs, no gallops Respiratory: CTA bilaterally, no wheezing, no rhonchi Abdominal: Soft, NT, ND, bowel sounds + Extremities: no cyanosis    The results of significant diagnostics from this hospitalization (including imaging, microbiology, ancillary and laboratory) are listed below for reference.     Microbiology: No results found for this or any previous visit (from the past 240 hour(s)).   Labs: BNP (last 3 results) No results for input(s): BNP in the last 8760 hours. Basic Metabolic Panel: Recent Labs  Lab 11/28/21 1356 11/29/21 0513 11/30/21 0504 12/01/21 0505  NA 134* 136 134* 135  K 3.7 3.4* 3.5 3.3*  CL 100 105 105 104  CO2  26 24 24 26   GLUCOSE 99 78 96 96  BUN 67* 68* 65* 63*  CREATININE 3.99* 4.04* 3.82* 3.67*  CALCIUM 8.8* 8.4* 8.2* 8.3*   Liver Function Tests: Recent Labs  Lab 11/28/21 1356 11/29/21 0513  AST 24 18  ALT 23 19  ALKPHOS 156* 135*  BILITOT 0.8 0.7  PROT 5.1* 4.2*  ALBUMIN 2.3* 1.9*   No results for input(s): LIPASE, AMYLASE in the last 168 hours. No results for input(s): AMMONIA in the last 168 hours. CBC: Recent Labs  Lab 11/28/21 1356 11/29/21 0513 11/30/21 0504 12/01/21 0505  WBC 3.2* 2.4* 2.6* 3.4*  NEUTROABS 1.9  --   --   --   HGB 9.5* 7.7* 7.7* 7.9*  HCT 28.6* 23.2* 23.1* 23.8*  MCV 98.3 95.9 96.7 98.3  PLT 244 149* 157 165   Cardiac Enzymes: No results for input(s): CKTOTAL, CKMB, CKMBINDEX, TROPONINI in the last 168 hours. BNP: Invalid input(s): POCBNP CBG: No results for input(s): GLUCAP in the last 168 hours. D-Dimer No results for input(s): DDIMER in the last 72 hours. Hgb A1c No results for input(s): HGBA1C in the last 72 hours. Lipid Profile No results for input(s): CHOL, HDL, LDLCALC, TRIG, CHOLHDL, LDLDIRECT in the last 72 hours. Thyroid function studies No results for input(s): TSH, T4TOTAL, T3FREE, THYROIDAB in the last 72 hours.  Invalid input(s): FREET3 Anemia  work up Recent Labs    11/28/21 1957  VITAMINB12 2,049*  FOLATE 82.0  FERRITIN 1,112*  TIBC 115*  IRON 40  RETICCTPCT 2.8   Urinalysis    Component Value Date/Time   COLORURINE YELLOW 08/28/2017 [place] 08/28/2017 1323   LABSPEC 1.015 08/28/2017 1323   PHURINE 8.0 08/28/2017 1323   GLUCOSEU 100 (A) 08/28/2017 1323   HGBUR NEGATIVE 08/28/2017 1323   BILIRUBINUR NEGATIVE 08/28/2017 1323   KETONESUR NEGATIVE 08/28/2017 1323   PROTEINUR 100 (A) 08/28/2017 1323   NITRITE NEGATIVE 08/28/2017 1323   LEUKOCYTESUR NEGATIVE 08/28/2017 1323   Sepsis Labs Invalid input(s): PROCALCITONIN,  WBC,  LACTICIDVEN Microbiology No results found for this or any previous visit (from the past 240 hour(s)).   Time coordinating discharge: Over 30 minutes  SIGNED:   Wyvonnia Dusky, MD  [facility] 12/01/2021, 11:17 AM Pager   If 7PM-7AM, please contact night-coverage

## 2021-12-01 NOTE — TOC Progression Note (Signed)
Transition of Care [facility]) - Progression Note    Patient Details  Name: Brianna Hamilton MRN: [id number] Date of Birth: Aug 01, 1956  [facility] CM/SW [place], RN Phone Number: 12/01/2021, 10:58 AM  Clinical Narrative:   Confirmed with [facility] today, 3 n 1 delivered to room. TOC to follow if needed         Expected Discharge Plan and Services                                                 Social Determinants of Health (SDOH) Interventions    Readmission Risk Interventions No flowsheet data found.

## 2021-12-05 ENCOUNTER — Other Ambulatory Visit: Payer: Self-pay

## 2021-12-05 ENCOUNTER — Emergency Department: Payer: Medicare HMO

## 2021-12-05 DIAGNOSIS — S81811A Laceration without foreign body, right lower leg, initial encounter: Secondary | ICD-10-CM | POA: Insufficient documentation

## 2021-12-05 DIAGNOSIS — R519 Headache, unspecified: Secondary | ICD-10-CM | POA: Insufficient documentation

## 2021-12-05 DIAGNOSIS — W01118A Fall on same level from slipping, tripping and stumbling with subsequent striking against other sharp object, initial encounter: Secondary | ICD-10-CM | POA: Insufficient documentation

## 2021-12-05 DIAGNOSIS — I120 Hypertensive chronic kidney disease with stage 5 chronic kidney disease or end stage renal disease: Secondary | ICD-10-CM | POA: Diagnosis not present

## 2021-12-05 DIAGNOSIS — Y92000 Kitchen of unspecified non-institutional (private) residence as  the place of occurrence of the external cause: Secondary | ICD-10-CM | POA: Diagnosis not present

## 2021-12-05 DIAGNOSIS — S41112A Laceration without foreign body of left upper arm, initial encounter: Secondary | ICD-10-CM | POA: Diagnosis present

## 2021-12-05 DIAGNOSIS — N186 End stage renal disease: Secondary | ICD-10-CM | POA: Insufficient documentation

## 2021-12-05 DIAGNOSIS — Z992 Dependence on renal dialysis: Secondary | ICD-10-CM | POA: Diagnosis not present

## 2021-12-05 DIAGNOSIS — Z85828 Personal history of other malignant neoplasm of skin: Secondary | ICD-10-CM | POA: Diagnosis not present

## 2021-12-05 DIAGNOSIS — S0192XA Laceration with foreign body of unspecified part of head, initial encounter: Secondary | ICD-10-CM | POA: Insufficient documentation

## 2021-12-05 DIAGNOSIS — R600 Localized edema: Secondary | ICD-10-CM | POA: Insufficient documentation

## 2021-12-05 NOTE — ED Triage Notes (Signed)
Pt was recently dx with LLE DVT and has had a recent admission for the same. PT was started on eliquis and has had recent falls for few weeks. 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 Pt fell today states has been due to chemo and has abrasion to left forehead. Also to right lower leg and left arm. Pt has been walking since, does have bruising to right forehead and throughout body from previous falls.

## 2021-12-06 ENCOUNTER — Emergency Department
Admission: EM | Admit: 2021-12-06 | Discharge: 2021-12-06 | Disposition: A | Discharge status: Home / Self Care | Payer: Medicare HMO | Attending: Emergency Medicine | Admitting: Emergency Medicine

## 2021-12-06 DIAGNOSIS — T148XXA Other injury of unspecified body region, initial encounter: Secondary | ICD-10-CM

## 2021-12-06 DIAGNOSIS — W19XXXA Unspecified fall, initial encounter: Secondary | ICD-10-CM

## 2021-12-06 LAB — COMPREHENSIVE METABOLIC PANEL
ALT: 28 U/L (ref 0–44)
AST: 33 U/L (ref 15–41)
Albumin: 2.5 g/dL — ABNORMAL LOW (ref 3.5–5.0)
Alkaline Phosphatase: 179 U/L — ABNORMAL HIGH (ref 38–126)
Anion gap: 11 (ref 5–15)
BUN: 78 mg/dL — ABNORMAL HIGH (ref 8–23)
CO2: 24 mmol/L (ref 22–32)
Calcium: 8.9 mg/dL (ref 8.9–10.3)
Chloride: 98 mmol/L (ref 98–111)
Creatinine, Ser: 4.58 mg/dL — ABNORMAL HIGH (ref 0.44–1.00)
GFR, Estimated: 10 mL/min — ABNORMAL LOW (ref >60)
Glucose, Bld: 125 mg/dL — ABNORMAL HIGH (ref 70–99)
Potassium: 4.1 mmol/L (ref 3.5–5.1)
Sodium: 133 mmol/L — ABNORMAL LOW (ref 135–145)
Total Bilirubin: 0.7 mg/dL (ref 0.3–1.2)
Total Protein: 5.4 g/dL — ABNORMAL LOW (ref 6.5–8.1)

## 2021-12-06 LAB — CBC
HCT: 31.4 % — ABNORMAL LOW (ref 36.0–46.0)
Hemoglobin: 10.3 g/dL — ABNORMAL LOW (ref 12.0–15.0)
MCH: 32 pg (ref 26.0–34.0)
MCHC: 32.8 g/dL (ref 30.0–36.0)
MCV: 97.5 fL (ref 80.0–100.0)
Platelets: 210 10*3/uL (ref 150–400)
RBC: 3.22 MIL/uL — ABNORMAL LOW (ref 3.87–5.11)
RDW: 19 % — ABNORMAL HIGH (ref 11.5–15.5)
WBC: 12.3 10*3/uL — ABNORMAL HIGH (ref 4.0–10.5)
nRBC: 0 % (ref 0.0–0.2)

## 2021-12-06 MED ORDER — ACETAMINOPHEN 500 MG PO TABS
1000.0000 mg | ORAL_TABLET | Freq: Once | ORAL | Status: AC
Start: 2021-12-06 — End: 2021-12-06
  Administered 2021-12-06: 1000 mg via ORAL
  Filled 2021-12-06: qty 2

## 2021-12-06 NOTE — ED Notes (Signed)
Patient discharged to home per MD order. Patient in stable condition, and deemed medically cleared by ED provider for discharge. Discharge instructions reviewed with patient/family using "Teach Back"; verbalized understanding of medication education and administration, and information about follow-up care. Denies further concerns. ° °

## 2021-12-06 NOTE — Discharge Instructions (Signed)

## 2021-12-06 NOTE — ED Provider Notes (Signed)
[facility] Provider Note    Event Date/Time   First MD Initiated Contact with Patient 12/06/21 [phone]     (approximate)   History   Fall   HPI  Brianna Hamilton is a 66 y.o. female  with a history of ESRD on HD, recently diagnosed DVT on Eliquis, mental retardation, pulmonary hypertension, anemia who presents for evaluation of a fall.  Patient is accompanied by her sister.  Patient has had progressively worsening bilateral lower extremity edema.  She was hospitalized last week after being found to have a DVT.  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  She reports that her legs are so swollen and heavy that she is having difficulty walking and therefore had another fall.  She was going to the kitchen when she tripped and fell.  She hit her face on the floor.  She sustained skin tears in her left arm and right leg.  She denies LOC.  No neck pain or back pain, no chest pain or abdominal pain.  No extremity pain.  She reports that she only came in because after being placed on blood thinners she was told that if she fell and hit her head she needed to be evaluated.     Past Medical History:  Diagnosis Date   Bleeding stomach ulcer    Cancer ([place])    basal cell   Dialysis patient ([place])    GERD (gastroesophageal reflux disease)    Hypertension    Rectal prolapse    Renal disorder     Past Surgical History:  Procedure Laterality Date   ABDOMINAL HYSTERECTOMY     COLONOSCOPY WITH PROPOFOL N/A 02/04/2018   Procedure: COLONOSCOPY WITH PROPOFOL;  Surgeon: Lin Landsman, MD;  Location: [facility] ENDOSCOPY;  Service: Gastroenterology;  Laterality: N/A;   ESOPHAGOGASTRODUODENOSCOPY (EGD) WITH PROPOFOL N/A 12/18/2016   Procedure: ESOPHAGOGASTRODUODENOSCOPY (EGD) WITH PROPOFOL;  Surgeon: Jonathon Bellows, MD;  Location: [facility] ENDOSCOPY;  Service: Endoscopy;  Laterality: N/A;   ESOPHAGOGASTRODUODENOSCOPY (EGD) WITH PROPOFOL N/A 02/28/2017    Procedure: ESOPHAGOGASTRODUODENOSCOPY (EGD) WITH PROPOFOL;  Surgeon: Jonathon Bellows, MD;  Location: [facility] ENDOSCOPY;  Service: Endoscopy;  Laterality: N/A;     Physical Exam   Triage Vital Signs: ED Triage Vitals  Enc Vitals Group     BP 12/05/21 2337 107/88     Pulse Rate 12/05/21 2337 86     Resp 12/05/21 2337 20     Temp 12/05/21 2343 97.7 F (36.5 C)     Temp Source 12/05/21 2343 Oral     SpO2 12/05/21 2337 96 %     Weight 12/05/21 2337 134 lb (60.8 kg)     Height --      Head Circumference --      Peak Flow --      Pain Score 12/05/21 2337 0     Pain Loc --      Pain Edu? --      Excl. in [place]? --     Most recent vital signs: Vitals:   12/05/21 2343 12/06/21 0543  BP:  110/70  Pulse:  71  Resp:  20  Temp: 97.7 F (36.5 C)   SpO2:  100%    Full spinal precautions maintained throughout the trauma exam. Constitutional: Alert and oriented. No acute distress. Does not appear intoxicated. HEENT Head: Normocephalic and atraumatic. Bruising to the forehead Face: No facial bony tenderness. Stable midface Ears: No hemotympanum bilaterally. No Battle sign Eyes:  No eye injury. PERRL. No raccoon eyes Nose: Nontender. No epistaxis. No rhinorrhea Mouth/Throat: Mucous membranes are moist. No oropharyngeal blood. No dental injury. Airway patent without stridor. Normal voice. Neck: no C-collar. No midline c-spine tenderness.  Cardiovascular: Normal rate, regular rhythm. Normal and symmetric distal pulses are present in all extremities. Pulmonary/Chest: Chest wall is stable and nontender to palpation/compression. Normal respiratory effort. Breath sounds are normal. No crepitus.  Abdominal: Soft, nontender, non distended. Musculoskeletal: Nontender with normal full range of motion in all extremities. No deformities. No thoracic or lumbar midline spinal tenderness. Pelvis is stable. Skin: Skin is warm, dry and intact.  Patient has several skin tears of the left forearm, left upper arm,  and right lower leg Psychiatric: Speech and behavior are appropriate. Neurological: Normal speech and language. Moves all extremities to command. No gross focal neurologic deficits are appreciated.  Glascow Coma Score: 4 - Opens eyes on own 6 - Follows simple motor commands 5 - Alert and oriented GCS: 15   ED Results / Procedures / Treatments   Labs (all labs ordered are listed, but only abnormal results are displayed) Labs Reviewed  CBC - Abnormal; Notable for the following components:      Result Value   WBC 12.3 (*)    RBC 3.22 (*)    Hemoglobin 10.3 (*)    HCT 31.4 (*)    RDW 19.0 (*)    All other components within normal limits  COMPREHENSIVE METABOLIC PANEL - Abnormal; Notable for the following components:   Sodium 133 (*)    Glucose, Bld 125 (*)    BUN 78 (*)    Creatinine, Ser 4.58 (*)    Total Protein 5.4 (*)    Albumin 2.5 (*)    Alkaline Phosphatase 179 (*)    GFR, Estimated 10 (*)    All other components within normal limits     EKG  none   RADIOLOGY I have personally reviewed the images performed during this visit and I agree with the Radiologist's read.   Interpretation by Radiologist:  CT HEAD WO CONTRAST (5MM)  Result Date: 12/06/2021 CLINICAL DATA:  Status post fall. EXAM: CT HEAD WITHOUT CONTRAST TECHNIQUE: Contiguous axial images were obtained from the base of the skull through the vertex without intravenous contrast. COMPARISON:  November 28, 2021 FINDINGS: Brain: There is mild cerebral atrophy with widening of the extra-axial spaces and ventricular dilatation. There are areas of decreased attenuation within the white matter tracts of the supratentorial brain, consistent with microvascular disease changes. Vascular: No hyperdense vessel or unexpected calcification. Skull: Normal. Negative for fracture or focal lesion. Sinuses/Orbits: Opacification of the right mastoid air cells is noted. Other: None. IMPRESSION: 1. Mild generalized cerebral atrophy.  2. No acute intracranial abnormality. Electronically Signed   By: Virgina Norfolk M.D.   On: 12/06/2021 00:12      PROCEDURES:  Critical Care performed: No  Procedures    IMPRESSION / MDM / ASSESSMENT AND PLAN / ED COURSE  I reviewed the triage vital signs and the nursing notes.  66 y.o. female  with a history of ESRD on HD, recently diagnosed DVT on Eliquis, mental retardation, pulmonary hypertension, anemia who presents for evaluation of a fall.  Patient with a mechanical fall from home with her sister.  Patient was admitted last week for bilateral lower extremity swelling and found to have DVTs for which she was started on Eliquis.  She reports difficulty walking and maintaining her balance due to the swelling of  her legs.  She is currently undergoing chemotherapy for a malignancy at the ampulla Vater.  She has sustained several skin tears and hit her head.  She denies any other injuries.  Ddx: Mild head injury versus intracranial bleed   Plan: CT head, CBC, CMP.   MEDICATIONS GIVEN IN ED: Medications  acetaminophen (TYLENOL) tablet 1,000 mg (has no administration in time range)     ED COURSE: Head CT visualized by me with no intracranial traumatic injury, confirmed by radiology.  Labs are stable when compared to baseline with no signs of neutropenia, improved hemoglobin at 10.3.  No lab findings concerning for need of emergent dialysis.  All skin tears were dressed with sterile dressing.  Tetanus shot is up-to-date.  Patient was given Tylenol for pain.  Discussed fall prevention at home and wound care with patient and her sister who was at bedside.  Discussed close follow-up with her primary care doctor and return to the ER for any new traumatic pain.  With a negative CT and stable labs indication for admission at this time   Consults: None   EMR reviewed EMR reviewed including recent admission records on 12/27 for DVT          FINAL CLINICAL IMPRESSION(S) / ED  DIAGNOSES   Final diagnoses:  Fall, initial encounter  Multiple skin tears     Rx / DC Orders   ED Discharge Orders     None        Note:  This document was prepared using Dragon voice recognition software and may include unintentional dictation errors.    Alfred Levins, [place], MD 12/06/21 [phone]

## 2022-01-31 DEATH — deceased

## 2022-06-18 IMAGING — CT CT HEAD W/O CM
4 series · 17 of 47 positions shown, 19 images · non-contrast
Comparison: November 28, 2021

CLINICAL DATA: Status post fall.

EXAM:
CT HEAD WITHOUT CONTRAST
TECHNIQUE: Contiguous axial images were obtained from the base of the skull
through the vertex without intravenous contrast.

[Series 2: head bone · axial · 0.43mm/px · z∈[+156,+228]mm · 5 of 77 slices shown]
[im 8/77  bone]
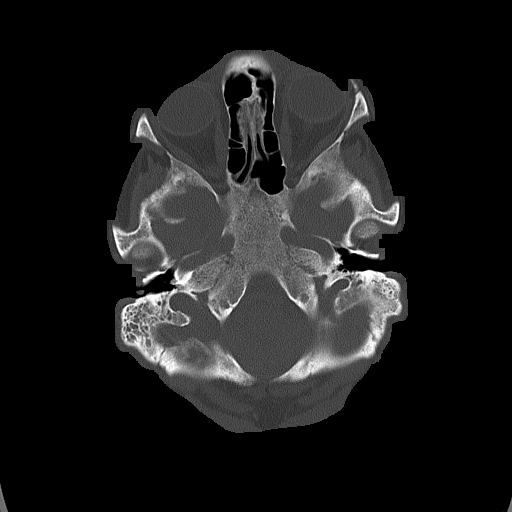
[im 15/77  bone]
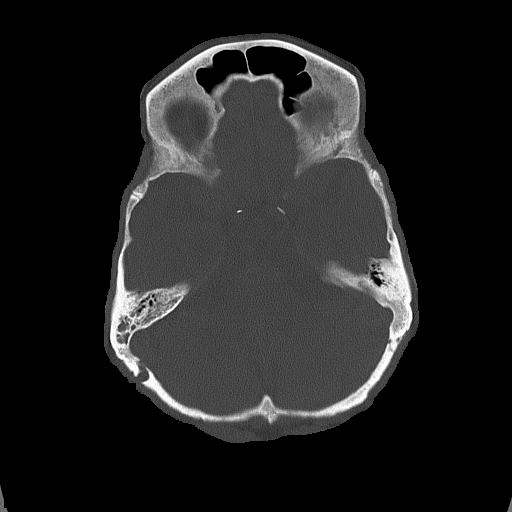
[im 26/77  bone]
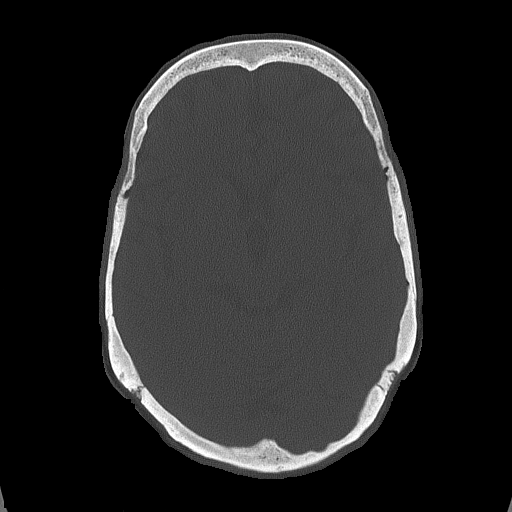
[im 33/77  bone]
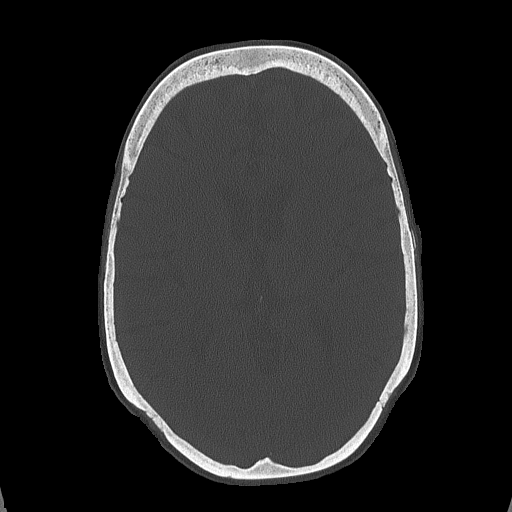
[im 44/77  bone]
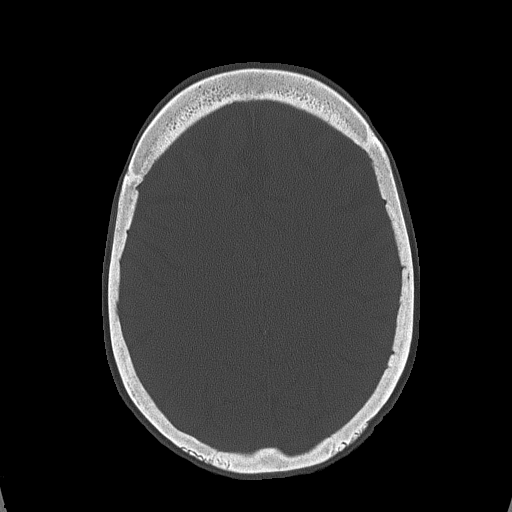

[Series 3: head wo · axial · 0.43mm/px · z∈[+162,+262]mm · 6 of 30 slices shown, 8 images]
[im 5/30  brain]
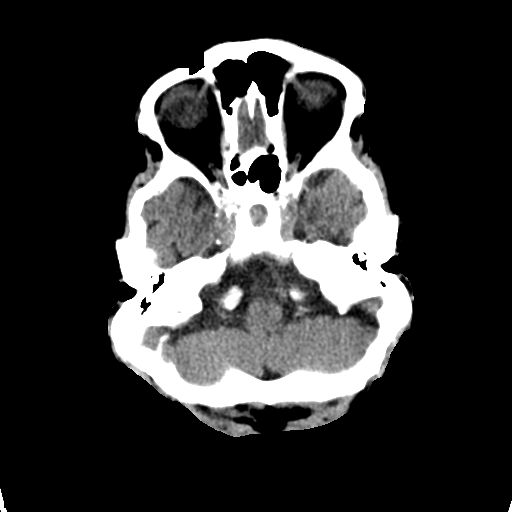
[im 5/30  bone]
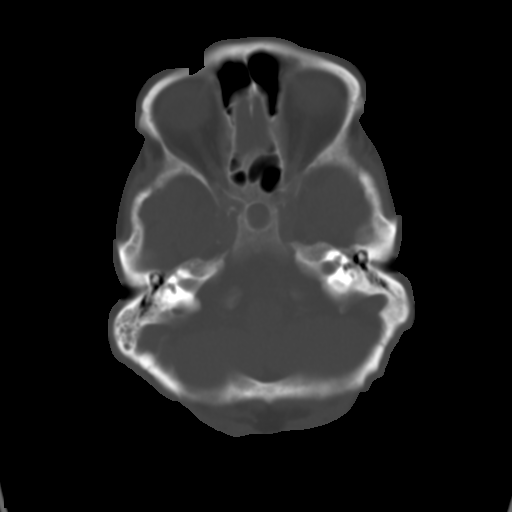
[im 9/30  brain]
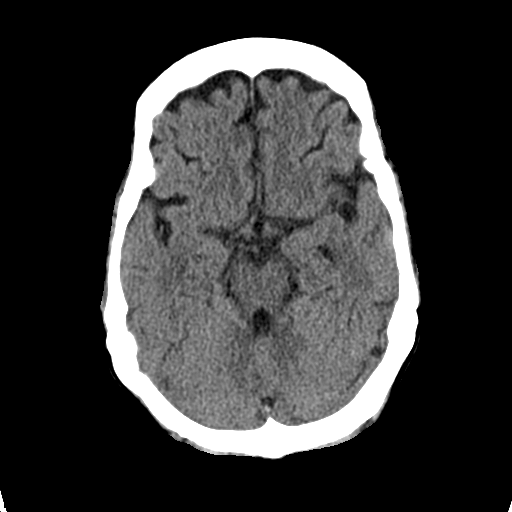
[im 13/30  brain]
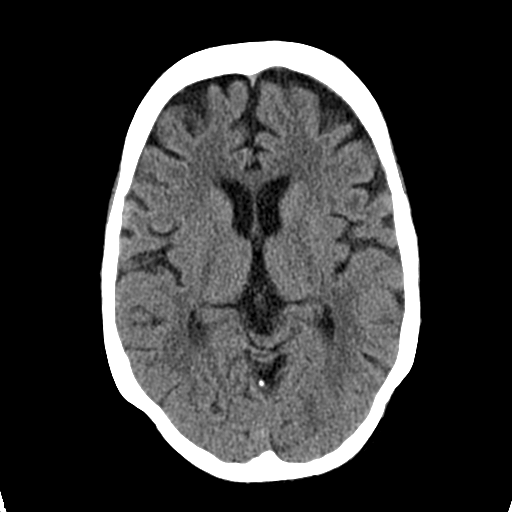
[im 17/30  brain]
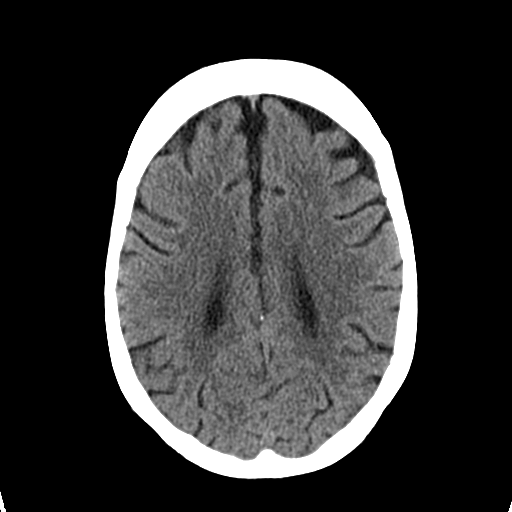
[im 21/30  brain]
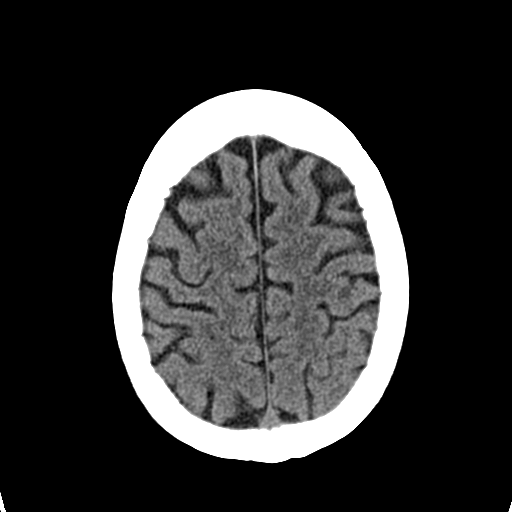
[im 21/30  bone]
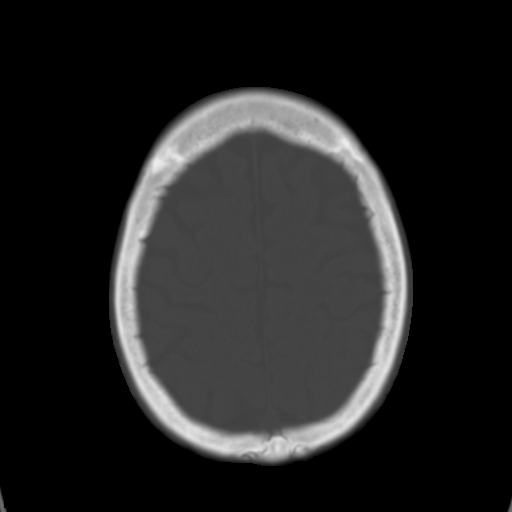
[im 25/30  brain]
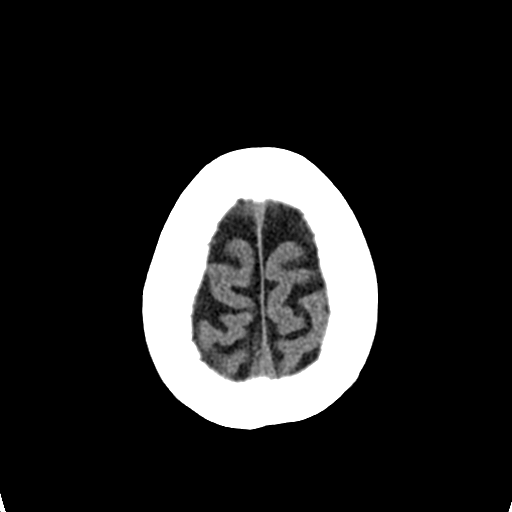

[Series 4: coronal soft tissue · coronal · 0.30mm/px · 3 of 65 slices shown]
[im 22/65  brain]
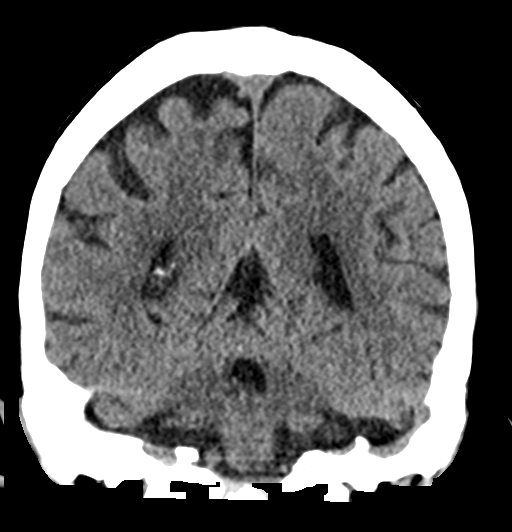
[im 29/65  brain]
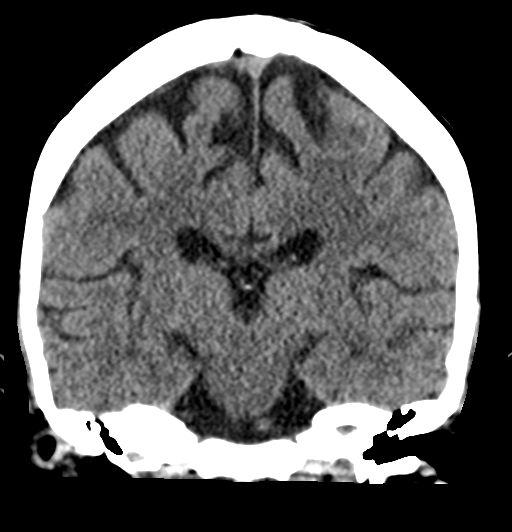
[im 36/65  brain]
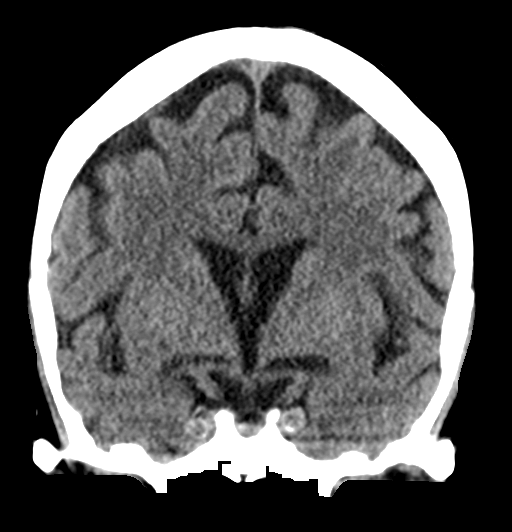

[Series 5: sagittal soft tissue · sagittal · 0.31mm/px · 3 of 51 slices shown]
[im 17/51  brain]
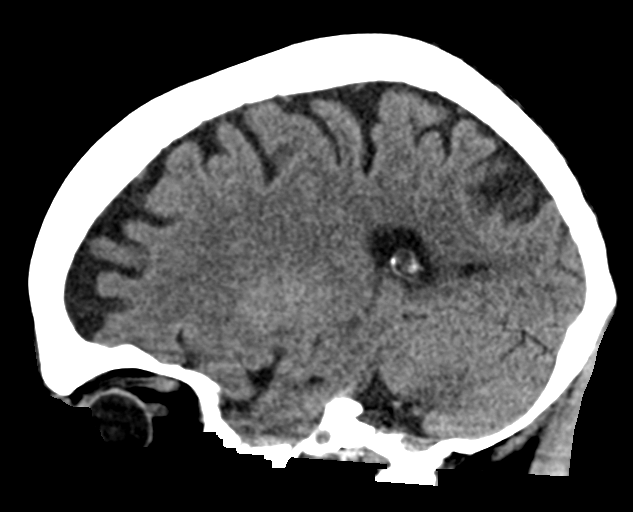
[im 26/51  brain]
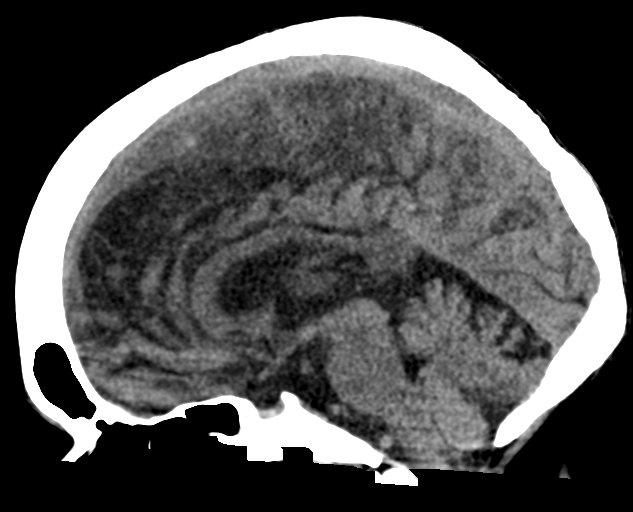
[im 34/51  brain]
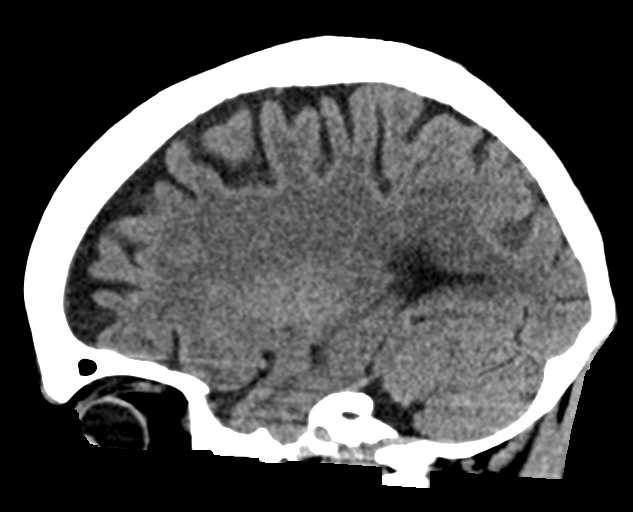

[17 of 47 positions shown; findings below may reference images not displayed]

FINDINGS: Brain: There is mild cerebral atrophy with widening of the
extra-axial spaces and ventricular dilatation.
There are areas of decreased attenuation within the white matter
tracts of the supratentorial brain, consistent with microvascular
disease changes.

Vascular: No hyperdense vessel or unexpected calcification.

Skull: Normal. Negative for fracture or focal lesion.

Sinuses/Orbits: Opacification of the right mastoid air cells is
noted.

Other: None.
IMPRESSION: 1. Mild generalized cerebral atrophy.
2. No acute intracranial abnormality.
